# Patient Record
Sex: Female | Born: 1985 | Race: Black or African American | Hispanic: No | Marital: Married | Smoking: Current every day smoker
Health system: Southern US, Community
[De-identification: ages and names within clinical notes are randomized; demographics above are authoritative.]

## PROBLEM LIST (undated history)

## (undated) DIAGNOSIS — M1990 Unspecified osteoarthritis, unspecified site: Secondary | ICD-10-CM

## (undated) DIAGNOSIS — E282 Polycystic ovarian syndrome: Secondary | ICD-10-CM

## (undated) DIAGNOSIS — F32A Depression, unspecified: Secondary | ICD-10-CM

## (undated) DIAGNOSIS — F419 Anxiety disorder, unspecified: Secondary | ICD-10-CM

## (undated) DIAGNOSIS — F329 Major depressive disorder, single episode, unspecified: Secondary | ICD-10-CM

## (undated) HISTORY — DX: Anxiety disorder, unspecified: F41.9

## (undated) HISTORY — PX: WISDOM TOOTH EXTRACTION: SHX21

## (undated) HISTORY — PX: BREAST REDUCTION SURGERY: SHX8

---
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
## 2006-02-06 ENCOUNTER — Emergency Department (HOSPITAL_COMMUNITY): Admission: EM | Admit: 2006-02-06 | Discharge: 2006-02-07 | Payer: Self-pay | Admitting: Emergency Medicine

## 2006-06-10 ENCOUNTER — Emergency Department (HOSPITAL_COMMUNITY): Admission: EM | Admit: 2006-06-10 | Discharge: 2006-06-10 | Payer: Self-pay | Admitting: Family Medicine

## 2007-02-21 ENCOUNTER — Emergency Department (HOSPITAL_COMMUNITY): Admission: EM | Admit: 2007-02-21 | Discharge: 2007-02-21 | Payer: Self-pay | Admitting: Emergency Medicine

## 2007-04-10 ENCOUNTER — Ambulatory Visit (HOSPITAL_COMMUNITY): Admission: RE | Admit: 2007-04-10 | Discharge: 2007-04-10 | Payer: Self-pay | Admitting: Obstetrics and Gynecology

## 2007-04-10 ENCOUNTER — Encounter (INDEPENDENT_AMBULATORY_CARE_PROVIDER_SITE_OTHER): Payer: Self-pay | Admitting: Obstetrics and Gynecology

## 2007-07-16 ENCOUNTER — Inpatient Hospital Stay (HOSPITAL_COMMUNITY): Admission: AD | Admit: 2007-07-16 | Discharge: 2007-07-16 | Payer: Self-pay | Admitting: Obstetrics and Gynecology

## 2007-07-16 ENCOUNTER — Inpatient Hospital Stay (HOSPITAL_COMMUNITY): Admission: AD | Admit: 2007-07-16 | Discharge: 2007-07-19 | Payer: Self-pay | Admitting: Obstetrics and Gynecology

## 2007-07-17 ENCOUNTER — Encounter (INDEPENDENT_AMBULATORY_CARE_PROVIDER_SITE_OTHER): Payer: Self-pay | Admitting: Obstetrics and Gynecology

## 2007-08-06 ENCOUNTER — Emergency Department (HOSPITAL_COMMUNITY): Admission: EM | Admit: 2007-08-06 | Discharge: 2007-08-06 | Payer: Self-pay | Admitting: Family Medicine

## 2009-07-01 ENCOUNTER — Emergency Department (HOSPITAL_COMMUNITY): Admission: EM | Admit: 2009-07-01 | Discharge: 2009-07-01 | Payer: Self-pay | Admitting: Family Medicine

## 2010-08-23 LAB — POCT RAPID STREP A (OFFICE): Streptococcus, Group A Screen (Direct): NEGATIVE

## 2010-10-20 NOTE — Op Note (Signed)
[name], [name]             ACCOUNT NO.:  [id number]   MEDICAL RECORD NO.:  [id number]          PATIENT TYPE:  AMB   LOCATION:  SDC                           FACILITY:  [facility]   PHYSICIAN:  Dois Davenport A. Rivard, M.D. DATE OF BIRTH:  24-Apr-1986   DATE OF PROCEDURE:  04/10/2007  DATE OF DISCHARGE:                               OPERATIVE REPORT   PREOPERATIVE DIAGNOSIS:  Intrauterine pregnancy at 24 weeks with right  vulvar lesion, for removal.   POSTOPERATIVE DIAGNOSIS:  Intrauterine pregnancy at 24 weeks with right  vulvar lesion, for removal.   ANESTHESIA:  Local, Crist Fat. Rivard, M.D.   PROCEDURE:  Excision of right vulvar lesion.   SURGEON:  Crist Fat. Rivard, M.D.  no assistant.   ESTIMATED BLOOD LOSS:  Minimal.   PROCEDURE:  After being informed of the planned procedure and being  consented, the patient is taken to OR #3 and placed in the lithotomy  position.  She is prepped and draped in a sterile fashion.  The right  vulvar lesion is then sprayed with Hurricaine spray and we proceed with  local anesthesia using Marcaine 0.25% 6 mL.  After assessing adequate  level of anesthesia, an elliptical incision is made around the lesion  and that incision is continued deeply to remove the underlying lesion.  All of it is removed without incident.  We cauterize the excision area  and then close the incision with four sutures of 4-0 Monocryl.   Instrument and sponge count is complete x2.  Estimated blood loss is  minimal.  The procedure is very well-tolerated by the patient, who is  taken to recovery room and discharged home in a well and stable  condition.   SPECIMEN:  Vulvar lesion sent to pathology.      Crist Fat Rivard, M.D.  Electronically Signed     SAR/MEDQ  D:  04/10/2007  T:  04/11/2007  Job:  [id number]

## 2010-10-20 NOTE — H&P (Signed)
[name], [name]             ACCOUNT NO.:  [id number]   MEDICAL RECORD NO.:  [id number]          PATIENT TYPE:  INP   LOCATION:  9112                          FACILITY:  [facility]   PHYSICIAN:  Naima A. Dillard, M.D. DATE OF BIRTH:  10-14-1985   DATE OF ADMISSION:  07/16/2007  DATE OF DISCHARGE:                              HISTORY & PHYSICAL   This is a 25 year old gravida 1, para 0 at 37-3/7 weeks who presents for  labor evaluation.  She was seen earlier today for the same thing and had  unchanged cervix at 1 cm.  Blood pressures were labile today but labs  were normal.  Pregnancy has been followed by the M.D. service and  remarkable for:  (1) HSV II, (2) cystic fibrosis chain  positive, (3)  group B strep negative.   ALLERGIES:  NONE.   PAST OB HISTORY:  The patient is primigravida.   PAST MEDICAL HISTORY:  1. HSV II.  2. Childhood varicella.   PAST SURGICAL HISTORY:  Wisdom teeth.   FAMILY HISTORY:  Remarkable for grandparents with hypertension,  grandmother with lung cancer, grandfather and grandmother with diabetes,  grandmother with stroke, and genetic history is remarkable for father of  the baby's nephew with autism.   SOCIAL HISTORY:  The patient is single.  Father of the baby, Sammie Bench, is involved and supportive.  She does not report a religious  affiliation.  She denies any alcohol, tobacco, or drug use.   PRENATAL LABS:  Hemoglobin 13.6, blood type O positive, sickle cell  negative, rubella immune.  Hepatitis negative, HIV negative.  Pap test  negative.   HISTORY OF CURRENT PREGNANCY:  The patient entered care at [redacted] weeks  gestation.  She had an ultrasound to confirm dates.  She was found be a  cystic fibrosis carrier.  The father of the baby was instructed to be  tested.  She had a first trimester screen that was normal.  Anatomy  ultrasound was normal but incomplete and was completed at 23 weeks.  She  had an ultrasound at 26 weeks that was normal.   She had a vulvar lesion  that came back as an epidermal cyst.  She had a fainting spell with  nausea at 27 weeks.  Glucola was done at 29 weeks and was normal.  She  was started on Valtrex at term.   OBJECTIVE:  VITAL SIGNS:  Stable, afebrile.  HEENT:  Within normal limits.  NECK:  Thyroid normal, not enlarged.  CHEST:  Clear to auscultation.  HEART:  Regular rate and rhythm.  ABDOMEN:  Gravid, vertex.  [facility] shows reactive fetal heart rate  with contractions every 3-4 minutes.  Cervix is 4-5 cm, 90% effaced, -1  station, vertex presentation.  There are no HSV lesions observed.  EXTREMITIES:  Within normal limits.   PIH labs were all normal today with negative protein in the urine test.   ASSESSMENT:  1. Intrauterine pregnancy at 37-3/7 weeks.  2. Active labor.  3. Labile hypertension.   PLAN:  1. Admit to birthing suite per Dr. Normand Sloop.  2.  Routine M.D. orders.  3. Epidural p.r.n.  4. Watch blood pressure.      Marie L. Williams, C.N.M.      Naima A. Normand Sloop, M.D.  Electronically Signed    MLW/MEDQ  D:  07/17/2007  T:  07/17/2007  Job:  [id number]

## 2011-02-26 LAB — COMPREHENSIVE METABOLIC PANEL
ALT: 15
AST: 22
AST: 22
AST: 22
Albumin: 3.1 — ABNORMAL LOW
Alkaline Phosphatase: 275 — ABNORMAL HIGH
BUN: 4 — ABNORMAL LOW
CO2: 21
CO2: 27
Calcium: 8.5
Calcium: 9.1
Chloride: 103
Chloride: 103
Creatinine, Ser: 0.5
Creatinine, Ser: 0.7
GFR calc Af Amer: >60
GFR calc Af Amer: >60
GFR calc Af Amer: >60
GFR calc non Af Amer: >60
GFR calc non Af Amer: >60
Potassium: 3.8
Potassium: 4.3
Sodium: 135
Total Bilirubin: 0.4
Total Protein: 7.4

## 2011-02-26 LAB — CBC
HCT: 37.6
HCT: 39.5
Hemoglobin: 13
MCHC: 34.2
MCV: 89.2
MCV: 89.7
Platelets: 322
RBC: 4.12
RBC: 4.23
RDW: 13.2
RDW: 13.3
WBC: 10.8 — ABNORMAL HIGH
WBC: 11.2 — ABNORMAL HIGH

## 2011-02-26 LAB — URINALYSIS, ROUTINE W REFLEX MICROSCOPIC
Glucose, UA: NEGATIVE
Nitrite: NEGATIVE
Specific Gravity, Urine: <1.005 — ABNORMAL LOW
pH: 6.5

## 2011-02-26 LAB — LACTATE DEHYDROGENASE: LDH: 201

## 2011-02-26 LAB — URIC ACID: Uric Acid, Serum: 4.3

## 2011-03-01 LAB — INFLUENZA A AND B ANTIGEN (CONVERTED LAB): Inflenza A Ag: NEGATIVE

## 2011-06-04 ENCOUNTER — Ambulatory Visit: Payer: Self-pay

## 2011-10-07 ENCOUNTER — Emergency Department (HOSPITAL_COMMUNITY)
Admission: EM | Admit: 2011-10-07 | Discharge: 2011-10-07 | Disposition: A | Discharge status: Home / Self Care | Payer: 59 | Source: Home / Self Care | Attending: Family Medicine | Admitting: Family Medicine

## 2011-10-07 ENCOUNTER — Encounter (HOSPITAL_COMMUNITY): Payer: Self-pay | Admitting: Emergency Medicine

## 2011-10-07 DIAGNOSIS — R0600 Dyspnea, unspecified: Secondary | ICD-10-CM

## 2011-10-07 DIAGNOSIS — R0989 Other specified symptoms and signs involving the circulatory and respiratory systems: Secondary | ICD-10-CM

## 2011-10-07 HISTORY — DX: Polycystic ovarian syndrome: E28.2

## 2011-10-07 MED ORDER — ALBUTEROL SULFATE HFA 108 (90 BASE) MCG/ACT IN AERS: 1.0000 | INHALATION_SPRAY | Freq: Four times a day (QID) | RESPIRATORY_TRACT | Status: DC | PRN

## 2011-10-07 MED ORDER — IPRATROPIUM BROMIDE 0.02 % IN SOLN
0.5000 mg | Freq: Once | RESPIRATORY_TRACT | Status: AC
  Administered 2011-10-07: 0.5 mg via RESPIRATORY_TRACT

## 2011-10-07 MED ORDER — ALBUTEROL SULFATE (5 MG/ML) 0.5% IN NEBU
5.0000 mg | INHALATION_SOLUTION | Freq: Once | RESPIRATORY_TRACT | Status: AC
  Administered 2011-10-07: 5 mg via RESPIRATORY_TRACT

## 2011-10-07 MED ORDER — ALBUTEROL SULFATE (5 MG/ML) 0.5% IN NEBU
INHALATION_SOLUTION | RESPIRATORY_TRACT | Status: AC
  Filled 2011-10-07: qty 1

## 2011-10-07 NOTE — ED Notes (Signed)
Patient reports able to get phlegm up that was bothering her

## 2011-10-07 NOTE — ED Notes (Signed)
Breathing treatment continues. 

## 2011-10-07 NOTE — ED Notes (Addendum)
Reports intermittently sob, worsened over the past 2 days. Sob episodes can be controlled by patient if she "yawns", this seems to help relieve sob per patient.    Reports sore throat, cough about 2 weeks ago.  Patient feels like she did get well.  Patient reports a lot of stressors recently.  Yesterday patient bent over, stood back up and maybe dizzy, but not sure-did fall against refrigerator , but never lost concsiousness

## 2011-10-07 NOTE — ED Provider Notes (Signed)
History     CSN: [id number]  Arrival date & time 10/07/11  1007   First MD Initiated Contact with Patient 10/07/11 1300      Chief Complaint  Patient presents with  . Shortness of Breath    (Consider location/radiation/quality/duration/timing/severity/associated sxs/prior treatment) HPI Comments: The patient reports having the sensation of not being able to get a deep breath for some time now. No hx of asthma, no cough or congestion. No runny nose. No fever. No swelling of ankles or calves. Does smoke. Denies any flights or long road trips. No hx of prior episodes.   The history is provided by the patient.    Past Medical History  Diagnosis Date  . PCOS (polycystic ovarian syndrome)     History reviewed. No pertinent past surgical history.  History reviewed. No pertinent family history.  History  Substance Use Topics  . Smoking status: Current Everyday Smoker  . Smokeless tobacco: Not on file  . Alcohol Use: Yes    OB History    Grav Para Term Preterm Abortions TAB SAB Ect Mult Living                  Review of Systems  Constitutional: Negative.   HENT: Negative.   Cardiovascular: Negative.   Gastrointestinal: Negative.   Genitourinary: Negative.   Musculoskeletal: Negative.   Skin: Negative.     Allergies  Review of patient's allergies indicates no known allergies.  Home Medications   Current Outpatient Rx  Name Route Sig Dispense Refill  . PRENATAL MULTIVITAMIN CH Oral Take 1 tablet by mouth daily.    . ALBUTEROL SULFATE HFA 108 (90 BASE) MCG/ACT IN AERS Inhalation Inhale 1-2 puffs into the lungs every 6 (six) hours as needed for wheezing. 1 Inhaler 0    BP 125/89  Pulse 78  Temp(Src) 98.1 F (36.7 C) (Oral)  Resp 20  SpO2 98%  LMP 08/25/2011  Physical Exam  Nursing note and vitals reviewed. Constitutional: She appears well-developed and well-nourished.  HENT:  Head: Normocephalic and atraumatic.  Mouth/Throat: Oropharynx is clear and  moist.  Neck: Normal range of motion. Neck supple. No thyromegaly present.  Cardiovascular: Normal rate and regular rhythm.   Pulmonary/Chest: Effort normal and breath sounds normal. No respiratory distress. She has no wheezes.  Abdominal: Soft. Bowel sounds are normal.  Lymphadenopathy:    She has no cervical adenopathy.  Skin: Skin is warm and dry.    ED Course  Procedures (including critical care time)   Labs Reviewed  POCT PREGNANCY, URINE   No results found.   1. Dyspnea       MDM          Randa Spike, MD 10/07/11 1401

## 2011-10-07 NOTE — Discharge Instructions (Signed)
Use the inhaler as needed. Follow up with your pcp

## 2012-05-26 ENCOUNTER — Ambulatory Visit (INDEPENDENT_AMBULATORY_CARE_PROVIDER_SITE_OTHER): Payer: 59 | Admitting: Family Medicine

## 2012-05-26 VITALS — BP 114/78 | HR 75 | Temp 98.8°F | Resp 16 | Ht 64.0 in | Wt 230.0 lb

## 2012-05-26 DIAGNOSIS — N926 Irregular menstruation, unspecified: Secondary | ICD-10-CM

## 2012-05-26 DIAGNOSIS — R1110 Vomiting, unspecified: Secondary | ICD-10-CM

## 2012-05-26 DIAGNOSIS — N912 Amenorrhea, unspecified: Secondary | ICD-10-CM

## 2012-05-26 DIAGNOSIS — K5289 Other specified noninfective gastroenteritis and colitis: Secondary | ICD-10-CM

## 2012-05-26 DIAGNOSIS — K529 Noninfective gastroenteritis and colitis, unspecified: Secondary | ICD-10-CM

## 2012-05-26 DIAGNOSIS — R110 Nausea: Secondary | ICD-10-CM

## 2012-05-26 LAB — POCT URINALYSIS DIPSTICK
Glucose, UA: NEGATIVE
Ketones, UA: NEGATIVE
Spec Grav, UA: 1.025
Urobilinogen, UA: 0.2

## 2012-05-26 LAB — COMPREHENSIVE METABOLIC PANEL
ALT: 13 U/L (ref 0–35)
Albumin: 3.9 g/dL (ref 3.5–5.2)
Alkaline Phosphatase: 56 U/L (ref 39–117)
CO2: 29 mEq/L (ref 19–32)
Glucose, Bld: 82 mg/dL (ref 70–99)
Potassium: 4 mEq/L (ref 3.5–5.3)
Sodium: 140 mEq/L (ref 135–145)
Total Protein: 6.9 g/dL (ref 6.0–8.3)

## 2012-05-26 LAB — POCT CBC
Granulocyte percent: 61 %G (ref 37–80)
MID (cbc): 0.6 (ref 0–0.9)
MPV: 7.7 fL (ref 0–99.8)
POC MID %: 6.5 %M (ref 0–12)
Platelet Count, POC: 414 10*3/uL (ref 142–424)
RBC: 5.04 M/uL (ref 4.04–5.48)

## 2012-05-26 LAB — POCT UA - MICROSCOPIC ONLY

## 2012-05-26 MED ORDER — ONDANSETRON 4 MG PO TBDP: ORAL_TABLET | ORAL | Status: DC

## 2012-05-26 NOTE — Patient Instructions (Addendum)
Drink plenty of liquids(no dairy or alcohol)  Return if more abdominal pain, or vomiting or passing blood, or fever.  Take over-the-counter Zantac twice daily  Use the Zofran one every 4-6 hours as needed for vomiting.

## 2012-05-26 NOTE — Progress Notes (Signed)
Subjective: 26 year old lady who's been having problems since last Saturday. 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 She does not know if being around anyone with the same problems. Her daughter has not had this, nor her husband. She has had nausea and vomiting. Bowels have been fairly normal. There was a little bit on the loose side but the rest of the time is been normal. She's had no urinary symptoms. Her last menstrual cycle was in October, but it is not unusual for her to be very irregular.  Objective: Pleasant lady in no major distress. Chest clear. No CVA tenderness. Abdomen has normal bowel sounds, soft without masses or specific tenderness.  Assessment: Nausea and vomiting  Plan: Check a CBC and UA and blood chemistries and proceed from there  Results for orders placed in visit on 05/26/12  POCT URINE PREGNANCY      Component Value Range   Preg Test, Ur Negative    POCT CBC      Component Value Range   WBC 9.0  4.6 - 10.2 K/uL   Lymph, poc 2.9  0.6 - 3.4   POC LYMPH PERCENT 32.5  10 - 50 %L   MID (cbc) 0.6  0 - 0.9   POC MID % 6.5  0 - 12 %M   POC Granulocyte 5.5  2 - 6.9   Granulocyte percent 61.0  37 - 80 %G   RBC 5.04  4.04 - 5.48 M/uL   Hemoglobin 14.2  12.2 - 16.2 g/dL   HCT, POC 16.1  09.6 - 47.9 %   MCV 89.8  80 - 97 fL   MCH, POC 28.2  27 - 31.2 pg   MCHC 31.3 (*) 31.8 - 35.4 g/dL   RDW, POC 04.5     Platelet Count, POC 414  142 - 424 K/uL   MPV 7.7  0 - 99.8 fL  POCT UA - MICROSCOPIC ONLY      Component Value Range   WBC, Ur, HPF, POC 0-1     RBC, urine, microscopic 8-12     Bacteria, U Microscopic TRACE     Mucus, UA SMALL     Epithelial cells, urine per micros 0-3     Crystals, Ur, HPF, POC NEG     Casts, Ur, LPF, POC NEG     Yeast, UA NEG    POCT URINALYSIS DIPSTICK      Component Value Range   Color, UA dark yellow     Clarity, UA cloudy     Glucose, UA neg     Bilirubin, UA neg     Ketones, UA neg     Spec Grav, UA 1.025     Blood, UA trace     pH, UA  7.0     Protein, UA trace     Urobilinogen, UA 0.2     Nitrite, UA neg     Leukocytes, UA Negative     Will treat symptomatically

## 2012-05-28 ENCOUNTER — Encounter: Payer: Self-pay | Admitting: *Deleted

## 2012-09-27 ENCOUNTER — Other Ambulatory Visit: Payer: Self-pay | Admitting: Gynecology

## 2013-01-09 LAB — OB RESULTS CONSOLE RPR: RPR: NONREACTIVE

## 2013-01-09 LAB — OB RESULTS CONSOLE RUBELLA ANTIBODY, IGM: Rubella: IMMUNE

## 2013-01-09 LAB — OB RESULTS CONSOLE ANTIBODY SCREEN: Antibody Screen: NEGATIVE

## 2013-01-09 LAB — OB RESULTS CONSOLE ABO/RH: RH Type: POSITIVE

## 2013-01-09 LAB — OB RESULTS CONSOLE HEPATITIS B SURFACE ANTIGEN: Hepatitis B Surface Ag: NEGATIVE

## 2013-01-09 LAB — OB RESULTS CONSOLE HIV ANTIBODY (ROUTINE TESTING): HIV: NONREACTIVE

## 2013-01-23 LAB — OB RESULTS CONSOLE GC/CHLAMYDIA
Chlamydia: NEGATIVE
Gonorrhea: NEGATIVE

## 2013-04-21 ENCOUNTER — Encounter (HOSPITAL_COMMUNITY): Payer: Self-pay | Admitting: Emergency Medicine

## 2013-04-21 ENCOUNTER — Emergency Department (HOSPITAL_COMMUNITY)
Admission: EM | Admit: 2013-04-21 | Discharge: 2013-04-21 | Disposition: A | Discharge status: Home / Self Care | Payer: 59 | Source: Home / Self Care | Attending: Family Medicine | Admitting: Family Medicine

## 2013-04-21 DIAGNOSIS — J069 Acute upper respiratory infection, unspecified: Secondary | ICD-10-CM

## 2013-04-21 LAB — POCT RAPID STREP A: Streptococcus, Group A Screen (Direct): NEGATIVE

## 2013-04-21 MED ORDER — GUAIFENESIN-CODEINE 100-10 MG/5ML PO SOLN: 5.0000 mL | Freq: Every evening | ORAL | Status: DC | PRN

## 2013-04-21 NOTE — ED Provider Notes (Signed)
[name] is a 27 y.o. female who presents to Urgent Care today for sore throat coughing congestion for 5 days. Patient has not tried any medications yet and she is [redacted] weeks pregnant and unsure what is safe to take. She denies any fevers or chills or trouble breathing or new nausea vomiting or diarrhea. She denies any known sick contacts he feels well otherwise. No chest pains or shortness of breath or palpitations.   Past Medical History  Diagnosis Date  . PCOS (polycystic ovarian syndrome)   . Anxiety    History  Substance Use Topics  . Smoking status: Current Every Day Smoker    Types: Cigarettes  . Smokeless tobacco: Not on file  . Alcohol Use: Yes   ROS as above Medications reviewed. No current facility-administered medications for this encounter.   Current Outpatient Prescriptions  Medication Sig Dispense Refill  . Prenatal Vit-Fe Fumarate-FA (PRENATAL MULTIVITAMIN) TABS Take 1 tablet by mouth daily.      Marland Kitchen albuterol (PROVENTIL HFA;VENTOLIN HFA) 108 (90 BASE) MCG/ACT inhaler Inhale 1-2 puffs into the lungs every 6 (six) hours as needed for wheezing.  1 Inhaler  0  . guaiFENesin-codeine 100-10 MG/5ML syrup Take 5 mLs by mouth at bedtime as needed for cough.  120 mL  0  . ondansetron (ZOFRAN ODT) 4 MG disintegrating tablet Take every 4 hours as needed for vomiting or nausea  10 tablet  0    Exam:  BP 118/79  Pulse 82  Temp(Src) 97.9 F (36.6 C) (Oral)  Resp 16  SpO2 100%  LMP 03/26/2012 Gen: Well NAD HEENT: EOMI,  MMM, posterior pharynx mild cobblestoning, and tympanic membranes are normal appearing bilaterally. Lungs: CTABL Nl WOB Heart: RRR no MRG Abd: NABS, NT, ND Exts: Non edematous BL  LE, warm and well perfused.   Results for orders placed during the hospital encounter of 04/21/13 (from the past 24 hour(s))  POCT RAPID STREP A ([facility] ONLY)     Status: None   Collection Time    04/21/13  9:41 AM      Result Value Range   Streptococcus, Group A  Screen (Direct) NEGATIVE  NEGATIVE   No results found.  Assessment and Plan: 27 y.o. female with viral URI with cough. Plan to treat symptomatically with Tylenol and codeine containing cough medication which are safe to use at this stage of pregnancy. Advised patient against NSAIDs. Followup with primary care provider if not improving. Discussed warning signs or symptoms. Please see discharge instructions. Patient expresses understanding.      Rodolph Bong, MD 04/21/13 [phone]

## 2013-04-21 NOTE — ED Notes (Signed)
Pt c/o sore throat onset Monday Sxs also include a dry cough... Daughter was treated for pos strep about 2 weeks ago Denies: f/v/n/d Pt is 6 months preg

## 2013-04-23 LAB — CULTURE, GROUP A STREP

## 2013-07-11 ENCOUNTER — Inpatient Hospital Stay (HOSPITAL_COMMUNITY): Payer: 59 | Admitting: Anesthesiology

## 2013-07-11 ENCOUNTER — Inpatient Hospital Stay (HOSPITAL_COMMUNITY)
Admission: AD | Admit: 2013-07-11 | Discharge: 2013-07-14 | DRG: 775 | Disposition: A | Discharge status: Home / Self Care | Payer: 59 | Source: Ambulatory Visit | Attending: Obstetrics and Gynecology | Admitting: Obstetrics and Gynecology

## 2013-07-11 ENCOUNTER — Encounter (HOSPITAL_COMMUNITY): Payer: 59 | Admitting: Anesthesiology

## 2013-07-11 ENCOUNTER — Encounter (HOSPITAL_COMMUNITY): Payer: Self-pay | Admitting: *Deleted

## 2013-07-11 DIAGNOSIS — O99214 Obesity complicating childbirth: Secondary | ICD-10-CM

## 2013-07-11 DIAGNOSIS — O4290 Premature rupture of membranes, unspecified as to length of time between rupture and onset of labor, unspecified weeks of gestation: Principal | ICD-10-CM | POA: Diagnosis present

## 2013-07-11 DIAGNOSIS — F341 Dysthymic disorder: Secondary | ICD-10-CM | POA: Diagnosis present

## 2013-07-11 DIAGNOSIS — E669 Obesity, unspecified: Secondary | ICD-10-CM | POA: Diagnosis present

## 2013-07-11 DIAGNOSIS — O99344 Other mental disorders complicating childbirth: Secondary | ICD-10-CM | POA: Diagnosis present

## 2013-07-11 DIAGNOSIS — O42919 Preterm premature rupture of membranes, unspecified as to length of time between rupture and onset of labor, unspecified trimester: Secondary | ICD-10-CM | POA: Diagnosis present

## 2013-07-11 HISTORY — DX: Major depressive disorder, single episode, unspecified: F32.9

## 2013-07-11 HISTORY — DX: Depression, unspecified: F32.A

## 2013-07-11 LAB — TYPE AND SCREEN
ABO/RH(D): O POS
Antibody Screen: NEGATIVE

## 2013-07-11 LAB — CBC
HCT: 32.5 % — ABNORMAL LOW (ref 36.0–46.0)
Hemoglobin: 11.3 g/dL — ABNORMAL LOW (ref 12.0–15.0)
MCH: 28.6 pg (ref 26.0–34.0)
MCHC: 34.8 g/dL (ref 30.0–36.0)
MCV: 82.3 fL (ref 78.0–100.0)
PLATELETS: 294 10*3/uL (ref 150–400)
RBC: 3.95 MIL/uL (ref 3.87–5.11)
RDW: 13.2 % (ref 11.5–15.5)
WBC: 9.1 10*3/uL (ref 4.0–10.5)

## 2013-07-11 LAB — RPR: RPR: NONREACTIVE

## 2013-07-11 LAB — POCT FERN TEST: POCT FERN TEST: POSITIVE

## 2013-07-11 LAB — ABO/RH: ABO/RH(D): O POS

## 2013-07-11 MED ORDER — MEASLES, MUMPS & RUBELLA VAC ~~LOC~~ INJ
0.5000 mL | INJECTION | Freq: Once | SUBCUTANEOUS | Status: DC
  Filled 2013-07-11: qty 0.5

## 2013-07-11 MED ORDER — DIBUCAINE 1 % RE OINT: 1.0000 "application " | TOPICAL_OINTMENT | RECTAL | Status: DC | PRN

## 2013-07-11 MED ORDER — OXYTOCIN 40 UNITS IN LACTATED RINGERS INFUSION - SIMPLE MED
62.5000 mL/h | INTRAVENOUS | Status: DC
  Administered 2013-07-11: 62.5 mL/h via INTRAVENOUS

## 2013-07-11 MED ORDER — SENNOSIDES-DOCUSATE SODIUM 8.6-50 MG PO TABS
2.0000 | ORAL_TABLET | ORAL | Status: DC
  Administered 2013-07-11 – 2013-07-14 (×4): 2 via ORAL
  Filled 2013-07-11: qty 2
  Filled 2013-07-11: qty 1
  Filled 2013-07-11 (×3): qty 2

## 2013-07-11 MED ORDER — LACTATED RINGERS IV SOLN
INTRAVENOUS | Status: DC
  Administered 2013-07-11 (×2): via INTRAVENOUS

## 2013-07-11 MED ORDER — OXYTOCIN BOLUS FROM INFUSION: 500.0000 mL | INTRAVENOUS | Status: DC

## 2013-07-11 MED ORDER — EPHEDRINE 5 MG/ML INJ
10.0000 mg | INTRAVENOUS | Status: DC | PRN
  Filled 2013-07-11: qty 4
  Filled 2013-07-11: qty 2

## 2013-07-11 MED ORDER — DIPHENHYDRAMINE HCL 50 MG/ML IJ SOLN: 12.5000 mg | INTRAMUSCULAR | Status: DC | PRN

## 2013-07-11 MED ORDER — LIDOCAINE HCL (PF) 1 % IJ SOLN
30.0000 mL | INTRAMUSCULAR | Status: DC | PRN
  Filled 2013-07-11 (×2): qty 30

## 2013-07-11 MED ORDER — SIMETHICONE 80 MG PO CHEW: 80.0000 mg | CHEWABLE_TABLET | ORAL | Status: DC | PRN

## 2013-07-11 MED ORDER — BENZOCAINE-MENTHOL 20-0.5 % EX AERO
1.0000 "application " | INHALATION_SPRAY | CUTANEOUS | Status: DC | PRN
  Administered 2013-07-11: 1 via TOPICAL
  Filled 2013-07-11: qty 56

## 2013-07-11 MED ORDER — PHENYLEPHRINE 40 MCG/ML (10ML) SYRINGE FOR IV PUSH (FOR BLOOD PRESSURE SUPPORT)
80.0000 ug | PREFILLED_SYRINGE | INTRAVENOUS | Status: DC | PRN
  Filled 2013-07-11: qty 10
  Filled 2013-07-11: qty 2

## 2013-07-11 MED ORDER — ONDANSETRON HCL 4 MG/2ML IJ SOLN: 4.0000 mg | INTRAMUSCULAR | Status: DC | PRN

## 2013-07-11 MED ORDER — CITRIC ACID-SODIUM CITRATE 334-500 MG/5ML PO SOLN: 30.0000 mL | ORAL | Status: DC | PRN

## 2013-07-11 MED ORDER — OXYTOCIN 40 UNITS IN LACTATED RINGERS INFUSION - SIMPLE MED
1.0000 m[IU]/min | INTRAVENOUS | Status: DC
  Filled 2013-07-11: qty 1000

## 2013-07-11 MED ORDER — PENICILLIN G POTASSIUM 5000000 UNITS IJ SOLR
2.5000 10*6.[IU] | INTRAMUSCULAR | Status: DC
  Administered 2013-07-11 (×2): 2.5 10*6.[IU] via INTRAVENOUS
  Filled 2013-07-11 (×4): qty 2.5

## 2013-07-11 MED ORDER — WITCH HAZEL-GLYCERIN EX PADS: 1.0000 "application " | MEDICATED_PAD | CUTANEOUS | Status: DC | PRN

## 2013-07-11 MED ORDER — OXYCODONE-ACETAMINOPHEN 5-325 MG PO TABS
1.0000 | ORAL_TABLET | ORAL | Status: DC | PRN
  Administered 2013-07-12 – 2013-07-13 (×2): 1 via ORAL
  Filled 2013-07-11 (×2): qty 1

## 2013-07-11 MED ORDER — DIPHENHYDRAMINE HCL 25 MG PO CAPS: 25.0000 mg | ORAL_CAPSULE | Freq: Four times a day (QID) | ORAL | Status: DC | PRN

## 2013-07-11 MED ORDER — PENICILLIN G POTASSIUM 5000000 UNITS IJ SOLR
5.0000 10*6.[IU] | Freq: Once | INTRAVENOUS | Status: AC
  Administered 2013-07-11: 5 10*6.[IU] via INTRAVENOUS
  Filled 2013-07-11: qty 5

## 2013-07-11 MED ORDER — LACTATED RINGERS IV SOLN: INTRAVENOUS | Status: DC

## 2013-07-11 MED ORDER — OXYCODONE-ACETAMINOPHEN 5-325 MG PO TABS: 1.0000 | ORAL_TABLET | ORAL | Status: DC | PRN

## 2013-07-11 MED ORDER — LACTATED RINGERS IV SOLN
500.0000 mL | Freq: Once | INTRAVENOUS | Status: AC
  Administered 2013-07-11: 500 mL via INTRAVENOUS

## 2013-07-11 MED ORDER — PRENATAL MULTIVITAMIN CH
1.0000 | ORAL_TABLET | Freq: Every day | ORAL | Status: DC
Start: 2013-07-12 — End: 2013-07-15
  Administered 2013-07-12 – 2013-07-14 (×3): 1 via ORAL
  Filled 2013-07-11 (×3): qty 1

## 2013-07-11 MED ORDER — EPHEDRINE 5 MG/ML INJ
10.0000 mg | INTRAVENOUS | Status: DC | PRN
  Filled 2013-07-11: qty 2

## 2013-07-11 MED ORDER — ONDANSETRON HCL 4 MG PO TABS: 4.0000 mg | ORAL_TABLET | ORAL | Status: DC | PRN

## 2013-07-11 MED ORDER — FENTANYL 2.5 MCG/ML BUPIVACAINE 1/10 % EPIDURAL INFUSION (WH - ANES)
14.0000 mL/h | INTRAMUSCULAR | Status: DC | PRN
  Administered 2013-07-11: 14 mL/h via EPIDURAL
  Filled 2013-07-11: qty 125

## 2013-07-11 MED ORDER — ACETAMINOPHEN 325 MG PO TABS
650.0000 mg | ORAL_TABLET | ORAL | Status: DC | PRN
Start: 2013-07-11 — End: 2013-07-11

## 2013-07-11 MED ORDER — IBUPROFEN 600 MG PO TABS: 600.0000 mg | ORAL_TABLET | Freq: Four times a day (QID) | ORAL | Status: DC | PRN

## 2013-07-11 MED ORDER — LACTATED RINGERS IV SOLN: 500.0000 mL | INTRAVENOUS | Status: DC | PRN

## 2013-07-11 MED ORDER — LIDOCAINE HCL (PF) 1 % IJ SOLN
INTRAMUSCULAR | Status: DC | PRN
  Administered 2013-07-11 (×4): 4 mL

## 2013-07-11 MED ORDER — IBUPROFEN 600 MG PO TABS
600.0000 mg | ORAL_TABLET | Freq: Four times a day (QID) | ORAL | Status: DC
  Administered 2013-07-11 – 2013-07-14 (×13): 600 mg via ORAL
  Filled 2013-07-11 (×16): qty 1

## 2013-07-11 MED ORDER — ONDANSETRON HCL 4 MG/2ML IJ SOLN: 4.0000 mg | Freq: Four times a day (QID) | INTRAMUSCULAR | Status: DC | PRN

## 2013-07-11 MED ORDER — TETANUS-DIPHTH-ACELL PERTUSSIS 5-2.5-18.5 LF-MCG/0.5 IM SUSP: 0.5000 mL | Freq: Once | INTRAMUSCULAR | Status: DC

## 2013-07-11 MED ORDER — BUTORPHANOL TARTRATE 1 MG/ML IJ SOLN: 1.0000 mg | INTRAMUSCULAR | Status: DC | PRN

## 2013-07-11 MED ORDER — TERBUTALINE SULFATE 1 MG/ML IJ SOLN: 0.2500 mg | Freq: Once | INTRAMUSCULAR | Status: DC | PRN

## 2013-07-11 MED ORDER — PHENYLEPHRINE 40 MCG/ML (10ML) SYRINGE FOR IV PUSH (FOR BLOOD PRESSURE SUPPORT)
80.0000 ug | PREFILLED_SYRINGE | INTRAVENOUS | Status: DC | PRN
  Filled 2013-07-11: qty 2

## 2013-07-11 MED ORDER — LANOLIN HYDROUS EX OINT: TOPICAL_OINTMENT | CUTANEOUS | Status: DC | PRN

## 2013-07-11 MED ORDER — MEDROXYPROGESTERONE ACETATE 150 MG/ML IM SUSP: 150.0000 mg | INTRAMUSCULAR | Status: DC | PRN

## 2013-07-11 NOTE — MAU Note (Signed)
Patient presents to MAU with c/o leaking clear fluid since 5:10 am. Irregular uc's.

## 2013-07-11 NOTE — H&P (Signed)
[name] is a 28 y.o. female G2P1 @ 35+5wks presenting for PPROM. Pt reports leaking clear fluid at 5am.  No regular ctx or vb.  + FM.    History OB History   Grav Para Term Preterm Abortions TAB SAB Ect Mult Living   2 1 1       1      Past Medical History  Diagnosis Date  . PCOS (polycystic ovarian syndrome)   . Anxiety     no meds  . Depression     no meds   History reviewed. No pertinent past surgical history. Family History: family history includes Diabetes in her maternal grandfather and paternal grandmother; Hypertension in her maternal grandmother and paternal grandfather. Social History:  reports that she has quit smoking. Her smoking use included Cigarettes. She smoked 0.00 packs per day. She does not have any smokeless tobacco history on file. She reports that she does not drink alcohol or use illicit drugs.   Prenatal Transfer Tool  Maternal Diabetes: No Genetic Screening: Declined Maternal Ultrasounds/Referrals: Normal Fetal Ultrasounds or other Referrals:  None Maternal Substance Abuse:  No Significant Maternal Medications:  None Significant Maternal Lab Results:  None Other Comments:  None  ROS  Dilation: 3 Effacement (%): 60 Station: -3;-2 Exam by:: L. Cresenzo, RN Blood pressure 130/74, pulse 97, temperature 98.1 F (36.7 C), temperature source Oral, resp. rate 20, height 5\' 5"  (1.651 m), weight 115.667 kg (255 lb), SpO2 99.00%. Exam Physical Exam  Gen - NAD Abd - gravid, NT Ext - NT, no edema PV - + grossly ruptured, cvx 3cm, clear fluid Prenatal labs: ABO, Rh: --/--/O POS, O POS (02/04 1030) Antibody: NEG (02/04 1030) Rubella: Immune (08/05 0000) RPR: Nonreactive (08/05 0000)  HBsAg: Negative (08/05 0000)  HIV: Non-reactive (08/05 0000)  GBS:     Assessment/Plan: Admit PCN prophylaxis - GBS unknown rec augmentation with pitocin, pt wants to await arrival of husband before starting pitocin   [name] 07/11/2013, 5:23  PM

## 2013-07-11 NOTE — Progress Notes (Signed)
Pt more uncomfortable - feeling better w/ epidural  FHT reassuring Toco irregular Cvx 5-6cm  A/P:  Exp mngt PCN

## 2013-07-11 NOTE — Anesthesia Preprocedure Evaluation (Signed)
Anesthesia Evaluation  Patient identified by MRN, date of birth, ID band Patient awake    Reviewed: Allergy & Precautions, H&P , NPO status , Patient's Chart, lab work & pertinent test results, reviewed documented beta blocker date and time   History of Anesthesia Complications Negative for: history of anesthetic complications  Airway Mallampati: III TM Distance: >3 FB Neck ROM: full    Dental  (+) Teeth Intact   Pulmonary neg pulmonary ROS, former smoker,  breath sounds clear to auscultation        Cardiovascular negative cardio ROS  Rhythm:regular Rate:Normal     Neuro/Psych Anxiety Depression negative neurological ROS     GI/Hepatic negative GI ROS, Neg liver ROS,   Endo/Other  Morbid obesity  Renal/GU negative Renal ROS     Musculoskeletal   Abdominal   Peds  Hematology negative hematology ROS (+)   Anesthesia Other Findings   Reproductive/Obstetrics (+) Pregnancy                           Anesthesia Physical Anesthesia Plan  ASA: III  Anesthesia Plan: Epidural   Post-op Pain Management:    Induction:   Airway Management Planned:   Additional Equipment:   Intra-op Plan:   Post-operative Plan:   Informed Consent: I have reviewed the patients History and Physical, chart, labs and discussed the procedure including the risks, benefits and alternatives for the proposed anesthesia with the patient or authorized representative who has indicated his/her understanding and acceptance.     Plan Discussed with:   Anesthesia Plan Comments:         Anesthesia Quick Evaluation

## 2013-07-11 NOTE — MAU Provider Note (Signed)
  History     CSN: [id number]  Arrival date and time: 07/11/13 [id number]   First Provider Initiated Contact with Patient 07/11/13 1001      Chief Complaint  Patient presents with  . Labor Eval   HPI  [name] is a 28 y.o. G2P1001 at 8337w5d who presents today for SROM. She states that her water broke at 0510. She states that contractions started soon after. She denies any complications with the pregnancy.   Past Medical History  Diagnosis Date  . PCOS (polycystic ovarian syndrome)   . Anxiety     History reviewed. No pertinent past surgical history.  Family History  Problem Relation Age of Onset  . Hypertension Maternal Grandmother   . Diabetes Maternal Grandfather   . Diabetes Paternal Grandmother   . Hypertension Paternal Grandfather     History  Substance Use Topics  . Smoking status: Former Smoker    Types: Cigarettes  . Smokeless tobacco: Not on file  . Alcohol Use: No    Allergies: No Known Allergies  Prescriptions prior to admission  Medication Sig Dispense Refill  . acetaminophen (TYLENOL) 500 MG tablet Take 1,000 mg by mouth every 6 (six) hours as needed.      . Prenatal Vit-Fe Fumarate-FA (PRENATAL MULTIVITAMIN) TABS Take 1 tablet by mouth daily.      . pseudoephedrine (SUDAFED) 30 MG tablet Take 30 mg by mouth every 4 (four) hours as needed for congestion.      . valACYclovir (VALTREX) 500 MG tablet Take 500 mg by mouth 2 (two) times daily.        ROS Physical Exam   Blood pressure 132/85, pulse 111, temperature 98 F (36.7 C), temperature source Oral, resp. rate 18, height 5\' 5"  (1.651 m), weight 115.667 kg (255 lb), SpO2 98.00%.  Physical Exam  Nursing note and vitals reviewed. Constitutional: She is oriented to person, place, and time. She appears well-developed and well-nourished. No distress.  Cardiovascular: Normal rate.   Respiratory: Effort normal.  GI: Soft. There is no tenderness.  Neurological: She is alert and oriented to person,  place, and time.  Skin: Skin is warm and dry.  Psychiatric: She has a normal mood and affect.    FHT: 145, moderate with accels, 2 variables when initially placed on monitors. None since.  Toco: q6 mins  MAU Course  Procedures  Results for orders placed during the hospital encounter of 07/11/13 (from the past 24 hour(s))  POCT FERN TEST     Status: None   Collection Time    07/11/13 10:08 AM      Result Value Range   POCT Fern Test Positive = ruptured amniotic membanes      1033: D/W Dr. Renaldo FiddlerAdkins, will admit to labor and delivery. She will put in orders, and be by to check the patient. Will defer vaginal exam at this time.  Assessment and Plan  PPROM Admit to labor and delivery   Tawnya CrookHogan, Heather Donovan 07/11/2013, 10:05 AM

## 2013-07-11 NOTE — Progress Notes (Signed)
SVD of vigerous female infant w/ apgars of 8,9.  NICU team in attendance for delivery b/c prematurity Placenta delivered spontaneous w/ 3VC.   No lacerations  Fundus firm.  EBL 300cc .

## 2013-07-11 NOTE — Anesthesia Procedure Notes (Signed)
Epidural Patient location during procedure: OB Start time: 07/11/2013 4:41 PM  Staffing Performed by: anesthesiologist   Preanesthetic Checklist Completed: patient identified, site marked, surgical consent, pre-op evaluation, timeout performed, IV checked, risks and benefits discussed and monitors and equipment checked  Epidural Patient position: sitting Prep: site prepped and draped and DuraPrep Patient monitoring: continuous pulse ox and blood pressure Approach: midline Injection technique: LOR air  Needle:  Needle type: Tuohy  Needle gauge: 17 G Needle length: 9 cm and 9 Needle insertion depth: 7 cm Catheter type: closed end flexible Catheter size: 19 Gauge Catheter at skin depth: 12 cm Test dose: negative  Assessment Events: blood not aspirated, injection not painful, no injection resistance, negative IV test and no paresthesia  Additional Notes Discussed risk of headache, infection, bleeding, nerve injury and failed or incomplete block.  Patient voices understanding and wishes to proceed.  Epidural placed easily on first attempt.  No paresthesia.  Patient tolerated procedure well with no apparent complications.  Jasmine DecemberA. Taylan Marez, MDReason for block:procedure for pain

## 2013-07-12 ENCOUNTER — Encounter (HOSPITAL_COMMUNITY): Payer: Self-pay

## 2013-07-12 LAB — CBC
HCT: 32.8 % — ABNORMAL LOW (ref 36.0–46.0)
Hemoglobin: 11.2 g/dL — ABNORMAL LOW (ref 12.0–15.0)
MCH: 28.4 pg (ref 26.0–34.0)
MCHC: 34.1 g/dL (ref 30.0–36.0)
MCV: 83 fL (ref 78.0–100.0)
PLATELETS: 279 10*3/uL (ref 150–400)
RBC: 3.95 MIL/uL (ref 3.87–5.11)
RDW: 13.2 % (ref 11.5–15.5)
WBC: 13.3 10*3/uL — ABNORMAL HIGH (ref 4.0–10.5)

## 2013-07-12 NOTE — Progress Notes (Signed)
Post Partum Day 1 Subjective: no complaints, up ad lib, voiding and tolerating PO. Baby in NICU  Objective: Blood pressure 134/84, pulse 101, temperature 98.2 F (36.8 C), temperature source Oral, resp. rate 18, height 5\' 5"  (1.651 m), weight 255 lb (115.667 kg), SpO2 96.00%, unknown if currently breastfeeding.  Physical Exam:  General: alert and cooperative Lochia: appropriate Uterine Fundus: firm Incision: perineum intact DVT Evaluation: No evidence of DVT seen on physical exam. Negative Homan's sign. No cords or calf tenderness. No significant calf/ankle edema.   Recent Labs  07/11/13 1030 07/12/13 0544  HGB 11.3* 11.2*  HCT 32.5* 32.8*    Assessment/Plan: Plan for discharge tomorrow   LOS: 1 day   CURTIS,CAROL G 07/12/2013, 8:48 AM

## 2013-07-12 NOTE — Lactation Note (Signed)
This note was copied from the chart of Tamara Boyce MediciLekessa Mallin. Lactation Consultation Note   Initial consult with this mom of a NICU baby, now 17 hours post partum, and baby is 34 6/7 weeks corrected gestation. Mom has very large breasts, so I fashioned a hands free bra for her from  The hospital mesh panties. Hand exp taught - mom has drops of colostrum Mom has private insurance and need to call for a DEP and to see if they will also cover a DEP rental, until her pump arrives. i will follow this mom and baby in the NICU.  Patient Name: [name] [id number]'s Date: 07/12/2013 Reason for consult: Initial assessment;Late preterm infant;NICU baby   Maternal Data Formula Feeding for Exclusion: Yes (baby in NICU) Infant to breast within first hour of birth: No Breastfeeding delayed due to:: Infant status Has patient been taught Hand Expression?: Yes Does the patient have breastfeeding experience prior to this delivery?: No  Feeding Feeding Type: Bottle Fed - Formula Nipple Type: Slow - flow  LATCH Score/Interventions Latch: Repeated attempts needed to sustain latch, nipple held in mouth throughout feeding, stimulation needed to elicit sucking reflex. Intervention(s): Adjust position;Assist with latch  Audible Swallowing: None Intervention(s): Skin to skin  Type of Nipple: Flat  Comfort (Breast/Nipple): Soft / non-tender     Hold (Positioning): Assistance needed to correctly position infant at breast and maintain latch.  LATCH Score: 5  Lactation Tools Discussed/Used Tools: Pump Breast pump type: Double-Electric Breast Pump WIC Program: No Pump Review: Setup, frequency, and cleaning;Milk Storage;Other (comment) (premie setting, hand expression and teaching from the NICU booklet) Initiated by:: bedside rn Date initiated:: 07/11/13   Consult Status Consult Status: Follow-up Date: 07/13/13 Follow-up type: In-patient    Alfred LevinsLee, Deltha Bernales Anne 07/12/2013, 1:55 PM

## 2013-07-12 NOTE — Anesthesia Postprocedure Evaluation (Signed)
  Anesthesia Post-op Note  Patient: [name]  Procedure(s) Performed: * No procedures listed *  Patient Location: PACU and Mother/Baby  Anesthesia Type:Epidural  Level of Consciousness: awake, alert  and oriented  Airway and Oxygen Therapy: Patient Spontanous Breathing  Post-op Pain: none  Post-op Assessment: Post-op Vital signs reviewed, Patient's Cardiovascular Status Stable, No headache, No backache, No residual numbness and No residual motor weakness  Post-op Vital Signs: Reviewed and stable  Complications: No apparent anesthesia complications

## 2013-07-12 NOTE — Progress Notes (Signed)
07/12/13 1550  Clinical Encounter Type  Visited With Patient and family together (husband Mal AmabileBrock, pt's father)  Visit Type Initial;Spiritual support;Social support  Spiritual Encounters  Spiritual Needs Emotional   Visited with pt, husband, and her father on WU to introduce Spiritual Care and chaplain availability.  Family appreciative.  At this time they are jovial and very pleased with [name]' progress, though they name anxiety about the possibility of facing leaving him in NICU upon mom's discharge.  Provided empathic listening, affirmation, and encouragement.  [place] will follow as we see family in NICU, but please also page as needed:  [phone].  Thank you.  [address]Chaplain Limuel Nieblas [place], [place] [phone]

## 2013-07-13 MED ORDER — IBUPROFEN 600 MG PO TABS: 600.0000 mg | ORAL_TABLET | Freq: Four times a day (QID) | ORAL | Status: DC

## 2013-07-13 MED ORDER — OXYCODONE-ACETAMINOPHEN 5-325 MG PO TABS: 1.0000 | ORAL_TABLET | ORAL | Status: DC | PRN

## 2013-07-13 NOTE — Discharge Summary (Signed)
Obstetric Discharge Summary Reason for Admission: rupture of membranes Prenatal Procedures: ultrasound Intrapartum Procedures: spontaneous vaginal delivery Postpartum Procedures: none Complications-Operative and Postpartum: none Hemoglobin  Date Value Range Status  07/12/2013 11.2* 12.0 - 15.0 g/dL Final  47/82/956212/20/2013 13.014.2  12.2 - 16.2 g/dL Final     HCT  Date Value Range Status  07/12/2013 32.8* 36.0 - 46.0 % Final     HCT, POC  Date Value Range Status  05/26/2012 45.3  37.7 - 47.9 % Final    Physical Exam:  General: alert and cooperative Lochia: appropriate Uterine Fundus: firm Incision: perineum intact DVT Evaluation: No evidence of DVT seen on physical exam. Negative Homan's sign. No cords or calf tenderness.  Discharge Diagnoses: preterm delivery @34  weeks  Discharge Information: Date: 07/13/2013 Activity: pelvic rest Diet: routine Medications: PNV, Ibuprofen and Percocet Condition: stable Instructions: refer to practice specific booklet Discharge to: home   Newborn Data: Live born female  Birth Weight: 5 lb 12.8 oz (2630 g) APGAR: 8, 9  Home with NICU.  Venessa Wickham G 07/13/2013, 9:02 AM

## 2013-07-13 NOTE — Progress Notes (Signed)
NP came to see pt for discharge this am... Pt inquired about staying an extra night, pt spoke with [employer] and was told by [employer] that she could stay and would only be required to pay 10% of cost for extra night... Pt spoke with significant other, and decided to stay an extra night... Julio Sicksarol Curtis, NP notified and gave order to cancel discharge for today.

## 2013-07-13 NOTE — Lactation Note (Signed)
This note was copied from the chart of Tamara Boyce MediciLekessa Figge. Lactation Consultation Note      Follow up consult with this mom and baby, in the NIC, now 17 hours post partum, and 34 6.7 weeks corrected gestation. Mom latched the baby for the first time with this assistance of a NICU nurse, and the baby was latched deeply with strong suckles.  I will follow this family in the NICU  Patient Name: [name] UJWJX'BToday's Date: 07/13/2013     Maternal Data    Feeding Feeding Type: Bottle Fed - Formula Nipple Type: Slow - flow Length of feed: 20 min  LATCH Score/Interventions Latch: Grasps breast easily, tongue down, lips flanged, rhythmical sucking. Intervention(s): Breast compression  Audible Swallowing: None Intervention(s): Skin to skin  Type of Nipple: Everted at rest and after stimulation  Comfort (Breast/Nipple): Soft / non-tender     Hold (Positioning): No assistance needed to correctly position infant at breast.  LATCH Score: 8  Lactation Tools Discussed/Used     Consult Status      Alfred LevinsLee, Chavela Justiniano Anne 07/13/2013, 2:59 PM

## 2013-07-13 NOTE — Lactation Note (Signed)
This note was copied from the chart of Tamara Boyce [name]. Lactation Consultation Note       Follow up consult with this mom of a term baby, now 42 hours post partum. Mom has been pumping and reports and increase in her expressed amounts.  I gave mom a hand pump and instructed her in it's use. i also gave mom the paper work for a DEP, if she decides to rent one tomorrow. i advised mom that she should rent a DEP, to use until her  Insurance DEP arrives  Patient Name: [name] GNFAO'ZToday's Date: 07/13/2013 Reason for consult: Follow-up assessment;NICU baby;Late preterm infant   Maternal Data    Feeding Feeding Type: Bottle Fed - Formula Nipple Type: Slow - flow Length of feed: 20 min  LATCH Score/Interventions Latch: Grasps breast easily, tongue down, lips flanged, rhythmical sucking.  Audible Swallowing: None  Type of Nipple: Everted at rest and after stimulation  Comfort (Breast/Nipple): Soft / non-tender     Hold (Positioning): No assistance needed to correctly position infant at breast.  LATCH Score: 8  Lactation Tools Discussed/Used Breast pump type: Manual (mom instructedinit's use)   Consult Status Consult Status: Follow-up Date: 07/14/13 Follow-up type: In-patient    [name], [name] 07/13/2013, 3:28 PM

## 2013-07-14 NOTE — Progress Notes (Signed)
Post Partum Day 3 Subjective: no complaints  Objective: Blood pressure 117/48, pulse 81, temperature 97.8 F (36.6 C), temperature source Oral, resp. rate 18, height 5\' 5"  (1.651 m), weight 115.667 kg (255 lb), SpO2 100.00%, unknown if currently breastfeeding.  Physical Exam:  General: alert Lochia: appropriate Uterine Fundus: firm Incision: healing well DVT Evaluation: No evidence of DVT seen on physical exam.   Recent Labs  07/11/13 1030 07/12/13 0544  HGB 11.3* 11.2*  HCT 32.5* 32.8*    Assessment/Plan: Discharge home   LOS: 3 days   Altin Sease M 07/14/2013, 8:17 AM

## 2013-07-16 NOTE — Progress Notes (Signed)
Post discharge review completed. 

## 2014-04-08 ENCOUNTER — Encounter (HOSPITAL_COMMUNITY): Payer: Self-pay

## 2014-11-08 LAB — OB RESULTS CONSOLE RUBELLA ANTIBODY, IGM: RUBELLA: IMMUNE

## 2014-11-08 LAB — OB RESULTS CONSOLE GC/CHLAMYDIA
CHLAMYDIA, DNA PROBE: NEGATIVE
Gonorrhea: NEGATIVE

## 2014-11-08 LAB — OB RESULTS CONSOLE ANTIBODY SCREEN: ANTIBODY SCREEN: NEGATIVE

## 2014-11-08 LAB — OB RESULTS CONSOLE ABO/RH: RH TYPE: POSITIVE

## 2014-11-08 LAB — OB RESULTS CONSOLE HEPATITIS B SURFACE ANTIGEN: HEP B S AG: NEGATIVE

## 2014-11-08 LAB — OB RESULTS CONSOLE HIV ANTIBODY (ROUTINE TESTING): HIV: NONREACTIVE

## 2014-11-08 LAB — OB RESULTS CONSOLE RPR: RPR: NONREACTIVE

## 2014-11-08 LAB — OB RESULTS CONSOLE GBS: GBS: POSITIVE

## 2015-06-08 NOTE — L&D Delivery Note (Signed)
Received 1 dose of IV Fentanyl w/ good result. Shortly thereafter, pt sat up for epidural and vtx was noted to be at the introitus. Pt became hysterical at that point. Cat 1 FHRT noted throughout the 1st and 2nd stage, w/ ctxs occurring every 1-3 minutes.  Delivery Note At 1:57 AM a viable female "as yet unnamed" was delivered precipitously via Vaginal, Spontaneous Delivery (Presentation: Occiput Anterior restituting to ROA). APGARS: 8, 9; weight 8 lbs 6.4 oz (3810 g).   Placenta status: Intact, Spontaneous, marginal insertion - sent to path for analysis. Cord: 3 vessels with the following complications: None. Cord pH: NA  Anesthesia: None  Episiotomy: None Lacerations: None Suture Repair: NA Est. Blood Loss (mL): 300  Mom to postpartum.  Baby to Couplet care / Skin to Skin.  Not adequately treated for GBS positive status due to precipitous labor & delivery.  Plans to breastfeed.  Planning inpatient BTL (consent signed on 05/05/15 per pt report, but form not located in pt's office record. Will continue our search and inquire with office as well.  Couple elects outpatient circumcision.  SWS consult entered due to pt w/ h/o anxiety and depression (no meds).  Sherre ScarletWILLIAMS, Verble Styron 06/23/2015, 2:31 AM

## 2015-06-16 ENCOUNTER — Inpatient Hospital Stay (HOSPITAL_COMMUNITY)
Admission: AD | Admit: 2015-06-16 | Discharge: 2015-06-16 | Disposition: A | Discharge status: Home / Self Care | Payer: Medicaid Other | Source: Ambulatory Visit | Attending: Obstetrics & Gynecology | Admitting: Obstetrics & Gynecology

## 2015-06-16 ENCOUNTER — Encounter (HOSPITAL_COMMUNITY): Payer: Self-pay

## 2015-06-16 DIAGNOSIS — O471 False labor at or after 37 completed weeks of gestation: Secondary | ICD-10-CM | POA: Diagnosis present

## 2015-06-16 DIAGNOSIS — O479 False labor, unspecified: Secondary | ICD-10-CM

## 2015-06-16 DIAGNOSIS — Z3A37 37 weeks gestation of pregnancy: Secondary | ICD-10-CM | POA: Insufficient documentation

## 2015-06-16 LAB — PROTEIN / CREATININE RATIO, URINE
CREATININE, URINE: 298 mg/dL
Protein Creatinine Ratio: 0.1 mg/mg{Cre} (ref 0.00–0.15)
TOTAL PROTEIN, URINE: 30 mg/dL

## 2015-06-16 LAB — COMPREHENSIVE METABOLIC PANEL
ALBUMIN: 3.2 g/dL — AB (ref 3.5–5.0)
ALK PHOS: 173 U/L — AB (ref 38–126)
ALT: 30 U/L (ref 14–54)
ANION GAP: 13 (ref 5–15)
AST: 24 U/L (ref 15–41)
BUN: 7 mg/dL (ref 6–20)
CHLORIDE: 102 mmol/L (ref 101–111)
CO2: 21 mmol/L — AB (ref 22–32)
Calcium: 9.1 mg/dL (ref 8.9–10.3)
Creatinine, Ser: 0.66 mg/dL (ref 0.44–1.00)
GFR calc non Af Amer: >60 mL/min (ref >60)
GLUCOSE: 133 mg/dL — AB (ref 65–99)
Potassium: 3.8 mmol/L (ref 3.5–5.1)
SODIUM: 136 mmol/L (ref 135–145)
Total Bilirubin: 0.7 mg/dL (ref 0.3–1.2)
Total Protein: 7.1 g/dL (ref 6.5–8.1)

## 2015-06-16 LAB — CBC
HCT: 35.8 % — ABNORMAL LOW (ref 36.0–46.0)
HEMOGLOBIN: 12.2 g/dL (ref 12.0–15.0)
MCH: 29 pg (ref 26.0–34.0)
MCHC: 34.1 g/dL (ref 30.0–36.0)
MCV: 85 fL (ref 78.0–100.0)
Platelets: 279 10*3/uL (ref 150–400)
RBC: 4.21 MIL/uL (ref 3.87–5.11)
RDW: 14.4 % (ref 11.5–15.5)
WBC: 8.5 10*3/uL (ref 4.0–10.5)

## 2015-06-16 LAB — URIC ACID: URIC ACID, SERUM: 5.3 mg/dL (ref 2.3–6.6)

## 2015-06-16 NOTE — MAU Note (Signed)
Pt G3P2 with c/o contractions since 1700 and every five minutes.  Denies vag bleeding or leaking of fluid. Reports baby is moving well.

## 2015-06-16 NOTE — MAU Provider Note (Signed)
History     30yo, G3P1102, 37.3 wks presents to the MAU unannounced for contractions since 1700 and every 5 min. +FM, denies lof or vb.   Patient Active Problem List   Diagnosis Date Noted  . Preterm premature rupture of membranes (PPROM) delivered, current hospitalization 07/11/2013  . SVD (spontaneous vaginal delivery) 07/11/2013    No chief complaint on file.  HPI  OB History    Gravida Para Term Preterm AB TAB SAB Ectopic Multiple Living   3 2 1 1      2       Past Medical History  Diagnosis Date  . PCOS (polycystic ovarian syndrome)   . Anxiety     no meds  . Depression     no meds    Past Surgical History  Procedure Laterality Date  . Wisdom tooth extraction      Family History  Problem Relation Age of Onset  . Hypertension Maternal Grandmother   . Diabetes Maternal Grandfather   . Diabetes Paternal Grandmother   . Hypertension Paternal Grandfather     Social History  Substance Use Topics  . Smoking status: Former Smoker    Types: Cigarettes    Quit date: 06/16/2011  . Smokeless tobacco: None  . Alcohol Use: No    Allergies: No Known Allergies  Prescriptions prior to admission  Medication Sig Dispense Refill Last Dose  . calcium carbonate (TUMS - DOSED IN MG ELEMENTAL CALCIUM) 500 MG chewable tablet Chew 2 tablets by mouth 2 (two) times daily as needed for indigestion or heartburn.   06/15/2015 at Unknown time  . Prenatal Vit-Fe Fumarate-FA (PRENATAL MULTIVITAMIN) TABS Take 1 tablet by mouth daily.   06/15/2015 at Unknown time  . valACYclovir (VALTREX) 500 MG tablet Take 500 mg by mouth 2 (two) times daily.   06/15/2015 at Unknown time  . acetaminophen (TYLENOL) 500 MG tablet Take 1,000 mg by mouth every 6 (six) hours as needed for mild pain.    prn  . [DISCONTINUED] ibuprofen (ADVIL,MOTRIN) 600 MG tablet Take 1 tablet (600 mg total) by mouth every 6 (six) hours. 30 tablet 1   . [DISCONTINUED] oxyCODONE-acetaminophen (PERCOCET/ROXICET) 5-325 MG per tablet  Take 1-2 tablets by mouth every 4 (four) hours as needed for severe pain (moderate - severe pain). 30 tablet 0 More than a month at Unknown time    ROS Physical Exam   Blood pressure 120/82, pulse 99, temperature 98.5 F (36.9 C), temperature source Oral, resp. rate 16, height 5\' 5"  (1.651 m), weight 117.482 kg (259 lb), unknown if currently breastfeeding.   SVE:  4/70/-2 by k.weiss, RN FH:  UC   Initially irregular  5-10 min, after 1 hr in MAU contraction stopped to rare    Physical Exam  Constitutional: She is oriented to person, place, and time. She appears well-developed and well-nourished.  HENT:  Head: Normocephalic and atraumatic.  Eyes: Conjunctivae are normal. Pupils are equal, round, and reactive to light.  Neck: Normal range of motion. Neck supple.  Cardiovascular: Normal rate and regular rhythm.   Respiratory: Effort normal and breath sounds normal.  GI: Soft. Bowel sounds are normal.  Musculoskeletal: Normal range of motion.  Neurological: She is alert and oriented to person, place, and time. She has normal reflexes.  Skin: Skin is warm and dry.  Psychiatric: She has a normal mood and affect. Her behavior is normal. Judgment and thought content normal.      ED Course  Assessment: IUP 37.[redacted]wks ega Braxton hicks  Contractions Membranes intact Cat 1 FT   Plan: Discharge home Labor precautions provided  Call if symptoms worsen   Alphonzo Severance CNM, MSN 06/16/2015 10:23 PM

## 2015-06-16 NOTE — Discharge Instructions (Signed)
Braxton Hicks Contractions °Contractions of the uterus can occur throughout pregnancy. Contractions are not always a sign that you are in labor.  °WHAT ARE BRAXTON HICKS CONTRACTIONS?  °Contractions that occur before labor are called Braxton Hicks contractions, or false labor. Toward the end of pregnancy (32-34 weeks), these contractions can develop more often and may become more forceful. This is not true labor because these contractions do not result in opening (dilatation) and thinning of the cervix. They are sometimes difficult to tell apart from true labor because these contractions can be forceful and people have different pain tolerances. You should not feel embarrassed if you go to the hospital with false labor. Sometimes, the only way to tell if you are in true labor is for your health care provider to look for changes in the cervix. °If there are no prenatal problems or other health problems associated with the pregnancy, it is completely safe to be sent home with false labor and await the onset of true labor. °HOW CAN YOU TELL THE DIFFERENCE BETWEEN TRUE AND FALSE LABOR? °False Labor °· The contractions of false labor are usually shorter and not as hard as those of true labor.   °· The contractions are usually irregular.   °· The contractions are often felt in the front of the lower abdomen and in the groin.   °· The contractions may go away when you walk around or change positions while lying down.   °· The contractions get weaker and are shorter lasting as time goes on.   °· The contractions do not usually become progressively stronger, regular, and closer together as with true labor.   °True Labor °· Contractions in true labor last 30-70 seconds, become very regular, usually become more intense, and increase in frequency.   °· The contractions do not go away with walking.   °· The discomfort is usually felt in the top of the uterus and spreads to the lower abdomen and low back.   °· True labor can be  determined by your health care provider with an exam. This will show that the cervix is dilating and getting thinner.   °WHAT TO REMEMBER °· Keep up with your usual exercises and follow other instructions given by your health care provider.   °· Take medicines as directed by your health care provider.   °· Keep your regular prenatal appointments.   °· Eat and drink lightly if you think you are going into labor.   °· If Braxton Hicks contractions are making you uncomfortable:   °¨ Change your position from lying down or resting to walking, or from walking to resting.   °¨ Sit and rest in a tub of warm water.   °¨ Drink 2-3 glasses of water. Dehydration may cause these contractions.   °¨ Do slow and deep breathing several times an hour.   °WHEN SHOULD I SEEK IMMEDIATE MEDICAL CARE? °Seek immediate medical care if: °· Your contractions become stronger, more regular, and closer together.   °· You have fluid leaking or gushing from your vagina.   °· You have a fever.   °· You pass blood-tinged mucus.   °· You have vaginal bleeding.   °· You have continuous abdominal pain.   °· You have low back pain that you never had before.   °· You feel your baby's head pushing down and causing pelvic pressure.   °· Your baby is not moving as much as it used to.   °  °This information is not intended to replace advice given to you by your health care provider. Make sure you discuss any questions you have with your health care   provider. °  °Document Released: 05/24/2005 Document Revised: 05/29/2013 Document Reviewed: 03/05/2013 °Elsevier Interactive Patient Education ©2016 [employer] Inc[employer] ° °

## 2015-06-23 ENCOUNTER — Inpatient Hospital Stay (HOSPITAL_COMMUNITY)
Admission: AD | Admit: 2015-06-23 | Discharge: 2015-06-25 | DRG: 767 | Disposition: A | Discharge status: Home / Self Care | Payer: Medicaid Other | Source: Ambulatory Visit | Attending: Obstetrics & Gynecology | Admitting: Obstetrics & Gynecology

## 2015-06-23 ENCOUNTER — Encounter (HOSPITAL_COMMUNITY): Payer: Self-pay

## 2015-06-23 DIAGNOSIS — Z8249 Family history of ischemic heart disease and other diseases of the circulatory system: Secondary | ICD-10-CM

## 2015-06-23 DIAGNOSIS — O99214 Obesity complicating childbirth: Secondary | ICD-10-CM | POA: Diagnosis present

## 2015-06-23 DIAGNOSIS — O09219 Supervision of pregnancy with history of pre-term labor, unspecified trimester: Secondary | ICD-10-CM

## 2015-06-23 DIAGNOSIS — A6000 Herpesviral infection of urogenital system, unspecified: Secondary | ICD-10-CM

## 2015-06-23 DIAGNOSIS — Z302 Encounter for sterilization: Secondary | ICD-10-CM

## 2015-06-23 DIAGNOSIS — Z820 Family history of epilepsy and other diseases of the nervous system: Secondary | ICD-10-CM

## 2015-06-23 DIAGNOSIS — Z6841 Body Mass Index (BMI) 40.0 and over, adult: Secondary | ICD-10-CM | POA: Diagnosis not present

## 2015-06-23 DIAGNOSIS — Z823 Family history of stroke: Secondary | ICD-10-CM

## 2015-06-23 DIAGNOSIS — O99824 Streptococcus B carrier state complicating childbirth: Secondary | ICD-10-CM | POA: Diagnosis present

## 2015-06-23 DIAGNOSIS — Z833 Family history of diabetes mellitus: Secondary | ICD-10-CM

## 2015-06-23 DIAGNOSIS — Z8669 Personal history of other diseases of the nervous system and sense organs: Secondary | ICD-10-CM

## 2015-06-23 DIAGNOSIS — Z8742 Personal history of other diseases of the female genital tract: Secondary | ICD-10-CM

## 2015-06-23 DIAGNOSIS — O09899 Supervision of other high risk pregnancies, unspecified trimester: Secondary | ICD-10-CM

## 2015-06-23 DIAGNOSIS — Z8659 Personal history of other mental and behavioral disorders: Secondary | ICD-10-CM

## 2015-06-23 DIAGNOSIS — Z87891 Personal history of nicotine dependence: Secondary | ICD-10-CM

## 2015-06-23 DIAGNOSIS — Z3A38 38 weeks gestation of pregnancy: Secondary | ICD-10-CM | POA: Diagnosis not present

## 2015-06-23 DIAGNOSIS — O9832 Other infections with a predominantly sexual mode of transmission complicating childbirth: Secondary | ICD-10-CM | POA: Diagnosis present

## 2015-06-23 DIAGNOSIS — Z8759 Personal history of other complications of pregnancy, childbirth and the puerperium: Secondary | ICD-10-CM

## 2015-06-23 DIAGNOSIS — R8271 Bacteriuria: Secondary | ICD-10-CM

## 2015-06-23 DIAGNOSIS — IMO0001 Reserved for inherently not codable concepts without codable children: Secondary | ICD-10-CM

## 2015-06-23 LAB — COMPREHENSIVE METABOLIC PANEL
ALK PHOS: 188 U/L — AB (ref 38–126)
ALT: 34 U/L (ref 14–54)
AST: 26 U/L (ref 15–41)
Albumin: 3.2 g/dL — ABNORMAL LOW (ref 3.5–5.0)
Anion gap: 11 (ref 5–15)
BUN: 8 mg/dL (ref 6–20)
CALCIUM: 9.7 mg/dL (ref 8.9–10.3)
CO2: 22 mmol/L (ref 22–32)
CREATININE: 0.59 mg/dL (ref 0.44–1.00)
Chloride: 105 mmol/L (ref 101–111)
Glucose, Bld: 108 mg/dL — ABNORMAL HIGH (ref 65–99)
Potassium: 4 mmol/L (ref 3.5–5.1)
Sodium: 138 mmol/L (ref 135–145)
Total Bilirubin: 0.9 mg/dL (ref 0.3–1.2)
Total Protein: 6.5 g/dL (ref 6.5–8.1)

## 2015-06-23 LAB — CBC
HCT: 36 % (ref 36.0–46.0)
HEMOGLOBIN: 12.3 g/dL (ref 12.0–15.0)
MCH: 28.9 pg (ref 26.0–34.0)
MCHC: 34.2 g/dL (ref 30.0–36.0)
MCV: 84.5 fL (ref 78.0–100.0)
Platelets: 278 10*3/uL (ref 150–400)
RBC: 4.26 MIL/uL (ref 3.87–5.11)
RDW: 14.5 % (ref 11.5–15.5)
WBC: 8.5 10*3/uL (ref 4.0–10.5)

## 2015-06-23 LAB — LACTATE DEHYDROGENASE: LDH: 127 U/L (ref 98–192)

## 2015-06-23 LAB — TYPE AND SCREEN
ABO/RH(D): O POS
ANTIBODY SCREEN: NEGATIVE

## 2015-06-23 LAB — RPR: RPR Ser Ql: NONREACTIVE

## 2015-06-23 LAB — URIC ACID: URIC ACID, SERUM: 5.1 mg/dL (ref 2.3–6.6)

## 2015-06-23 MED ORDER — DIPHENHYDRAMINE HCL 50 MG/ML IJ SOLN: 12.5000 mg | INTRAMUSCULAR | Status: DC | PRN

## 2015-06-23 MED ORDER — DIPHENHYDRAMINE HCL 25 MG PO CAPS: 25.0000 mg | ORAL_CAPSULE | Freq: Four times a day (QID) | ORAL | Status: DC | PRN

## 2015-06-23 MED ORDER — FENTANYL CITRATE (PF) 100 MCG/2ML IJ SOLN
INTRAMUSCULAR | Status: AC
  Filled 2015-06-23: qty 2

## 2015-06-23 MED ORDER — ONDANSETRON HCL 4 MG PO TABS: 4.0000 mg | ORAL_TABLET | ORAL | Status: DC | PRN

## 2015-06-23 MED ORDER — CITRIC ACID-SODIUM CITRATE 334-500 MG/5ML PO SOLN: 30.0000 mL | ORAL | Status: DC | PRN

## 2015-06-23 MED ORDER — OXYCODONE-ACETAMINOPHEN 5-325 MG PO TABS: 1.0000 | ORAL_TABLET | ORAL | Status: DC | PRN

## 2015-06-23 MED ORDER — PHENYLEPHRINE 40 MCG/ML (10ML) SYRINGE FOR IV PUSH (FOR BLOOD PRESSURE SUPPORT)
80.0000 ug | PREFILLED_SYRINGE | INTRAVENOUS | Status: DC | PRN
  Filled 2015-06-23: qty 2

## 2015-06-23 MED ORDER — ONDANSETRON HCL 4 MG/2ML IJ SOLN: 4.0000 mg | Freq: Four times a day (QID) | INTRAMUSCULAR | Status: DC | PRN

## 2015-06-23 MED ORDER — FERROUS SULFATE 325 (65 FE) MG PO TABS
325.0000 mg | ORAL_TABLET | Freq: Two times a day (BID) | ORAL | Status: DC
  Administered 2015-06-23 – 2015-06-25 (×4): 325 mg via ORAL
  Filled 2015-06-23 (×4): qty 1

## 2015-06-23 MED ORDER — PRENATAL MULTIVITAMIN CH
1.0000 | ORAL_TABLET | Freq: Every day | ORAL | Status: DC
  Administered 2015-06-23 – 2015-06-25 (×2): 1 via ORAL
  Filled 2015-06-23 (×2): qty 1

## 2015-06-23 MED ORDER — FENTANYL 2.5 MCG/ML BUPIVACAINE 1/10 % EPIDURAL INFUSION (WH - ANES)
INTRAMUSCULAR | Status: AC
  Filled 2015-06-23: qty 125

## 2015-06-23 MED ORDER — WITCH HAZEL-GLYCERIN EX PADS: 1.0000 "application " | MEDICATED_PAD | CUTANEOUS | Status: DC | PRN

## 2015-06-23 MED ORDER — ONDANSETRON HCL 4 MG/2ML IJ SOLN: 4.0000 mg | INTRAMUSCULAR | Status: DC | PRN

## 2015-06-23 MED ORDER — OXYTOCIN BOLUS FROM INFUSION: 500.0000 mL | INTRAVENOUS | Status: DC

## 2015-06-23 MED ORDER — LIDOCAINE HCL (PF) 1 % IJ SOLN
30.0000 mL | INTRAMUSCULAR | Status: DC | PRN
  Filled 2015-06-23: qty 30

## 2015-06-23 MED ORDER — BENZOCAINE-MENTHOL 20-0.5 % EX AERO
1.0000 "application " | INHALATION_SPRAY | CUTANEOUS | Status: DC | PRN
  Administered 2015-06-23: 1 via TOPICAL
  Filled 2015-06-23: qty 56

## 2015-06-23 MED ORDER — EPHEDRINE 5 MG/ML INJ
10.0000 mg | INTRAVENOUS | Status: DC | PRN
  Filled 2015-06-23: qty 2

## 2015-06-23 MED ORDER — FENTANYL CITRATE (PF) 100 MCG/2ML IJ SOLN
100.0000 ug | INTRAMUSCULAR | Status: DC | PRN
  Administered 2015-06-23: 100 ug via INTRAVENOUS

## 2015-06-23 MED ORDER — FENTANYL 2.5 MCG/ML BUPIVACAINE 1/10 % EPIDURAL INFUSION (WH - ANES): 14.0000 mL/h | INTRAMUSCULAR | Status: DC | PRN

## 2015-06-23 MED ORDER — LACTATED RINGERS IV SOLN
INTRAVENOUS | Status: DC
Start: 2015-06-23 — End: 2015-06-25
  Administered 2015-06-23 – 2015-06-24 (×2): via INTRAVENOUS

## 2015-06-23 MED ORDER — ACETAMINOPHEN 325 MG PO TABS
650.0000 mg | ORAL_TABLET | ORAL | Status: DC | PRN
  Administered 2015-06-24: 650 mg via ORAL
  Filled 2015-06-23: qty 2

## 2015-06-23 MED ORDER — SENNOSIDES-DOCUSATE SODIUM 8.6-50 MG PO TABS
2.0000 | ORAL_TABLET | ORAL | Status: DC
  Administered 2015-06-24 – 2015-06-25 (×2): 2 via ORAL
  Filled 2015-06-23 (×2): qty 2

## 2015-06-23 MED ORDER — SODIUM CHLORIDE 0.9 % IV SOLN
2.0000 g | Freq: Once | INTRAVENOUS | Status: AC
  Administered 2015-06-23: 2 g via INTRAVENOUS
  Filled 2015-06-23: qty 2000

## 2015-06-23 MED ORDER — LACTATED RINGERS IV SOLN: 500.0000 mL | INTRAVENOUS | Status: DC | PRN

## 2015-06-23 MED ORDER — SODIUM CHLORIDE 0.9 % IJ SOLN
3.0000 mL | INTRAMUSCULAR | Status: DC | PRN
  Administered 2015-06-23: 3 mL via INTRAVENOUS

## 2015-06-23 MED ORDER — DIBUCAINE 1 % RE OINT: 1.0000 "application " | TOPICAL_OINTMENT | RECTAL | Status: DC | PRN

## 2015-06-23 MED ORDER — OXYCODONE-ACETAMINOPHEN 5-325 MG PO TABS: 2.0000 | ORAL_TABLET | ORAL | Status: DC | PRN

## 2015-06-23 MED ORDER — FLEET ENEMA 7-19 GM/118ML RE ENEM: 1.0000 | ENEMA | RECTAL | Status: DC | PRN

## 2015-06-23 MED ORDER — PHENYLEPHRINE 40 MCG/ML (10ML) SYRINGE FOR IV PUSH (FOR BLOOD PRESSURE SUPPORT)
PREFILLED_SYRINGE | INTRAVENOUS | Status: AC
  Filled 2015-06-23: qty 10

## 2015-06-23 MED ORDER — SIMETHICONE 80 MG PO CHEW: 80.0000 mg | CHEWABLE_TABLET | ORAL | Status: DC | PRN

## 2015-06-23 MED ORDER — OXYTOCIN 10 UNIT/ML IJ SOLN
2.5000 [IU]/h | INTRAVENOUS | Status: DC
  Administered 2015-06-23: 39.96 [IU]/h via INTRAVENOUS
  Filled 2015-06-23: qty 4

## 2015-06-23 MED ORDER — IBUPROFEN 600 MG PO TABS
600.0000 mg | ORAL_TABLET | Freq: Four times a day (QID) | ORAL | Status: DC
  Administered 2015-06-23 – 2015-06-25 (×10): 600 mg via ORAL
  Filled 2015-06-23 (×11): qty 1

## 2015-06-23 MED ORDER — TETANUS-DIPHTH-ACELL PERTUSSIS 5-2.5-18.5 LF-MCG/0.5 IM SUSP: 0.5000 mL | Freq: Once | INTRAMUSCULAR | Status: DC

## 2015-06-23 MED ORDER — ACETAMINOPHEN 325 MG PO TABS: 650.0000 mg | ORAL_TABLET | ORAL | Status: DC | PRN

## 2015-06-23 MED ORDER — LANOLIN HYDROUS EX OINT: TOPICAL_OINTMENT | CUTANEOUS | Status: DC | PRN

## 2015-06-23 MED ORDER — ZOLPIDEM TARTRATE 5 MG PO TABS: 5.0000 mg | ORAL_TABLET | Freq: Every evening | ORAL | Status: DC | PRN

## 2015-06-23 NOTE — Lactation Note (Signed)
This note was copied from the chart of [name]. Lactation Consultation Note  Patient Name: [name] UJWJX'BToday's Date: 06/23/2015 Reason for consult: Initial assessment;Other (Comment) (per mom baby last fed at 0825 for 20 mins , mom encouraged to call with feeding cues , see LC note )  Baby is 8 hours old and has been to the breast x 3 for 15 -20 mins, presently sleeping in the bedside crib.  Per mom only breast fed 2nd baby for 17 months, and then recently at home wanted to re- latch.  LC discussed if the 30 year  old continues to want to latch - it is important to always meet the needs to the baby 1st and share that with the 30 year old, on the other hand  The 30 year old may have been curious and may not ask again .  LC discussed the importance of skin to skin feedings to enhance milk coming in quicker.  Mom asked about starting pumping. LC recommended to keep it simple for now and offering the breast with feeding cues, and a lot of skin to skin with be beneficial.  Per mom also very tired, LC also encouraged rest, naps, plenty of fluids.  LC did read on moms hx PCOS. If mom breast fed for 17 months - milk supply probably wasn't a challenge. Mother informed of post-discharge support and given phone number to the lactation department, including services for phone call assistance; out-patient appointments; and breastfeeding support group. List of other breastfeeding resources in the community given in the handout. Encouraged mother to call for problems or concerns related to breastfeeding.   Maternal Data Does the patient have breastfeeding experience prior to this delivery?: Yes  Feeding Feeding Type:  (baby last fed at 0825 for 20 mins ) Length of feed: 20 min  LATCH Score/Interventions                      Lactation Tools Discussed/Used     Consult Status Consult Status: Follow-up Date: 06/23/15 Follow-up type: In-patient    Kathrin Greathouseorio, Challis Crill  Ann 06/23/2015, 10:08 AM

## 2015-06-23 NOTE — H&P (Signed)
[name] is a 30 y.o. female, G3P1102 at 38.2 weeks, presenting for regular, painful ctxs and SROM (clear fluid) since 23:50 last evening. Endorses fetal movement. Denies h/a, visual changes, epigastric pain, difficulty breathing, VB or recent outbreak(s) or prodome -- H/O HSV. Desires postpartum tubal ligation prior to discharge. States consent signed and witnessed in office on 05/05/2015.  Patient Active Problem List   Diagnosis Date Noted  . Normal labor 06/23/2015  . GBS bacteriuria 06/23/2015  . History of depression 06/23/2015  . History of anxiety 06/23/2015  . History of PCOS 06/23/2015  . Morbid obesity (HCC) 06/23/2015  . Postpartum tubal ligation planned 06/23/2015  . Genital herpes 06/23/2015  . H/O preterm delivery, currently pregnant 06/23/2015  . History of prior pregnancy with SGA newborn x2 06/23/2015  . Preterm labor third tri w preterm del third tri, fetus 2 06/23/2015  . History of gestational hypertension - first delivery - no meds 06/23/2015  . H/O migraine 06/23/2015    History of present pregnancy: Patient entered care at 6.6 weeks.   EDC of 07/05/15 was established by u/s at 8.3 wks.   Anatomy scan: 20.2 weeks. EFW: 14 oz (83.5%tile). FHR 153 bpm. Singleton pregnancy. Vertex pres. Anterior placenta. Placental edge is 7.4 cm from the internal os- normal. Cvx closed. Normal fluid - VP= 5.1 cm. Profile/Palate, NB seen. Philtrum seen. Open hands, 5th digit, heel and feet seen. Anatomy is complete. Additional US evaluations: 8.3 wks (dating): Anteverted uterus. Singleton 8.3 wks IUP. FHR 159 bpm. YS seen. Ovaries and adnexas unremarkable. 12.4 wks (NT screening): CRL 6.32 --5762w4d. FHR 160 bpm. Anteverted uterus. Amnion seen. Placenta appears to be anterior. Normal fluid. Consistent with established GA. Cx closed. Ovaries/adnexas unremarkable. Singleton IUP. Normal NT at 1.2 cm.  36.6 wks (S>D): VERTEX PRESENTATION. ANTERIOR PLACENTA. NORMAL AFI (15.45 CM -  55TH%TILE). ADNEXAS UNREMARKABLE. EFW 3233G (7LBS 2OZ - 72.3%).  Significant prenatal events: Prepregnant wt = 230 lbs. TWG this pregnancy = 24.5 lbs. Normal pregnancy complaints. Treated w/ PCN for GBS bacteriuria on 11/08/14; TOC neg on 05/19/15. Declined 17P this pregnancy as she believes the EDD calculation in her 2nd pregnancy was incorrect. She did however receive progesterone during that pregnancy. Normal progesterone level this pregnancy. Treated for BV at 20.2 wks. Morbidly obese - normal glucola x 2. Desires outpatient circumcision for infant and tubal ligation during current hospitalization. H/O Gestational Hypertension in first pregnancy (no meds), no issues this pregnancy. H/O HSV - started on suppressive Valtrex therapy at 34 wks due to deliveries at 38 and 35.5 wks respectively. S>D at 35.3 wks. Growth scan completed at 36.6 wks - results as noted above. Received both Tdap and flu on 06/19/15. Seen in MAU on 06/16/15 for ctxs; labor ruled out. Last evaluation: Office 37.5 wks by Dr. Normand Sloopillard. Vtx presentation. S>D. FHR 144 bpm. Cvx 3/50/-3. BP 114/68. Wt: 254.5 lbs.  OB History    Gravida Para Term Preterm AB TAB SAB Ectopic Multiple Living   3 2 1 1      2      SVB on 07/17/2007 @ 38 wks, 24-hr labor, female infant "[place]", birthwt 5+4. Labor epidural. [facility] Dr[facility] Norm[facility] Sloopillard. Pregnancy c/b Gestational Hypertension. 1st degree lac noted & repaired. PTB on 07/11/2013 @ 35.5 wks, 15-hr labor, female infant "[place]", birthwt 5+12. Labor epidural. [facility].  Past Medical History  Diagnosis Date  . PCOS (polycystic ovarian syndrome)   . Anxiety  no meds  . Depression     no meds   Past Surgical History  Procedure Laterality Date  . Wisdom tooth extraction     Family History: family history includes Diabetes in her maternal grandfather and paternal grandmother; Hypertension in her maternal grandmother and paternal grandfather.Hypertension in her PGM and  MGF, stroke in her PGM, Alzheimer's in her MGF, anemia in her sister, thyroid gland disorder in her paternal aunt, lung cancer in her MGM and prostate cancer in her MGF. Social History:  reports that she quit smoking about 4 years ago. Her smoking use included Cigarettes. She does not have any smokeless tobacco history on file. 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    Prenatal Transfer Tool  Maternal Diabetes: No Genetic Screening: Normal Maternal Ultrasounds/Referrals: Normal Fetal Ultrasounds or other Referrals: No Maternal Substance Abuse:  No Significant Maternal Medications:  Meds include: Other: PNV, Valtrex 1g daily since 34 wks Significant Maternal Lab Results: Lab values include: Group B Strep positive  TDAP: 06/19/15 Flu: 06/19/15  ROS:10 Systems reviewed and are negative for acute change except as noted in the HPI.   No Known Allergies  VS on admission = BP 137/97, pulse 83, temperature 97.5, temperature source oral, resp. rate 18, height 5'5" (1.651 m), weight 115.667 kg (255 lbs), Sp02 98%.  Gen: Distress Chest clear Heart RRR without murmur Abd gravid, NT, FH 40.5 cm Speculum: No lesions on cvx, perineum, buttocks or in vaginal vault. Grossly ruptured w/ clear pooling in vault -- able to see fetus' hair Pelvic: 5-6/90/-1  Cephalic by Leopolds and VE EFW: 7 3/4 lbs. Pelvis proven to 5+12 FHR: Category 1 UCs: every 1-3 minutes  Prenatal labs: ABO, Rh: --/--/O POS (01/16 0120) Antibody: NEG (01/16 0120) Rubella: Immune (11/08/14)   RPR: Nonreactive (06/03 0000)  HBsAg: Negative (06/03 0000)  HIV: Non-reactive (06/03 0000)  GBS: Positive (06/03 0000)  Sickle cell/Hgb electrophoresis: Normal study (11/08/14) Pap: Normal (12/04/14) GC: Neg  (11/08/14) Chlamydia: Neg (11/08/14) Genetic screenings: 1st trimester aneuploidy neg (12/25/14); AFP4 Neg (01/22/15) Glucola: Early glucola normal at 103; 114 (normal) at 28 wks Other: Normal progesterone (22.5) and HBA1C (5.3) on 12/04/14. Vitamin D level normal (30) on 02/17/15 Hgb 13.0 at NOB, 11.4 at 28 weeks  Assessment: IUP at 38.2 wks Entering active phase labor H/O depression and anxiety; no meds H/O genital HSV - no recent outbreak(s) or prodrome, no lesions on exam. On suppressive Valtrex since 34 weeks GBS positive Cat 1 FHRT Morbid obesity (BMI 42.7)  Plan: Admit to [facility]. Routine [facility] orders. preE labs w/ admission labs due to initial BP in MAU 137/97, and also due to gHTN in first pregnancy. Pain med/epidural prn.  Ampicillin for GBS prophylaxis due to advanced labor. SWS in the pp period due to h/o depression and anxiety. Unable to locate BTL consent in [place]. Pt states will call office this morning to inquire, but we will continue to search in the meantime. Expect progress and SVD.  Robyne Askew, MS 06/23/2015, 1:30 AM

## 2015-06-23 NOTE — MAU Note (Signed)
Pt states SROM at 2350-clear. Contractions every 3-5 mins. +FM. Denies vag bleeding. Was 3.5 cm on Thursday.

## 2015-06-23 NOTE — Progress Notes (Addendum)
Subjective: Postpartum Day 0: Vaginal delivery, no laceration Patient up ad lib, reports no syncope or dizziness. Feeding:  Breast Contraceptive plan:  Desires BTL  Plans outpatient circumcision.  Objective: Vital signs in last 24 hours: Temp:  [97.4 F (36.3 C)-98.7 F (37.1 C)] 98.7 F (37.1 C) (01/16 0845) Pulse Rate:  [77-90] 82 (01/16 0845) Resp:  [16-18] 16 (01/16 0845) BP: (119-139)/(56-97) 119/56 mmHg (01/16 0845) SpO2:  [98 %-99 %] 99 % (01/16 0444) Weight:  [115.667 kg (255 lb)] 115.667 kg (255 lb) (01/16 0144)  Physical Exam:  General: alert Lochia: appropriate Uterine Fundus: firm Perineum: Clear DVT Evaluation: No evidence of DVT seen on physical exam. Negative Homan's sign.   CBC Latest Ref Rng 06/23/2015 06/16/2015 07/12/2013  WBC 4.0 - 10.5 K/uL 8.5 8.5 13.3(H)  Hemoglobin 12.0 - 15.0 g/dL 16.112.3 09.612.2 11.2(L)  Hematocrit 36.0 - 46.0 % 36.0 35.8(L) 32.8(L)  Platelets 150 - 400 K/uL 278 279 279     Assessment/Plan: Status post vaginal delivery day 0. Stable Desires pp BTL. Continue current care. BTL consent obtained from office and placed in shadow chart. Signed 05/05/15. R&B of BTL reviewed with patient, including option of salpingectomy. Will make NPO after MN for tubal tomorrow--Dr. Rivard aware, and will post for Dr. Stefano GaulStringer for tomorrow. Maintain IV access.   Nyra CapesLATHAM, VICKICNM 06/23/2015, 10:13 AM

## 2015-06-23 NOTE — Lactation Note (Signed)
This note was copied from the chart of [name]. Lactation Consultation Note  Observed mom latch baby easily in cross cradle hold.  Baby nursed actively.  Reviewed basics.  No questions or concerns at present.  Patient Name: Tamara Boyce MediciLekessa Nole WUJWJ'XToday'[name] Date: 06/23/2015 Reason for consult: Initial assessment;Other (Comment) (per mom baby last fed at 0825 for 20 mins , mom encouraged to call with feeding cues , see LC note )   Maternal Data Does the patient have breastfeeding experience prior to this delivery?: Yes  Feeding Feeding Type: Breast Fed Length of feed: 20 min  LATCH Score/Interventions Latch: Grasps breast easily, tongue down, lips flanged, rhythmical sucking.  Audible Swallowing: Spontaneous and intermittent  Type of Nipple: Everted at rest and after stimulation  Comfort (Breast/Nipple): Soft / non-tender     Hold (Positioning): No assistance needed to correctly position infant at breast.  LATCH Score: 10  Lactation Tools Discussed/Used     Consult Status Consult Status: Follow-up Date: 06/23/15 Follow-up type: In-patient    Huston FoleyMOULDEN, [name] 06/23/2015, 11:45 AM

## 2015-06-24 ENCOUNTER — Encounter (HOSPITAL_COMMUNITY): Payer: Self-pay | Admitting: Certified Registered Nurse Anesthetist

## 2015-06-24 ENCOUNTER — Inpatient Hospital Stay (HOSPITAL_COMMUNITY): Payer: Medicaid Other | Admitting: Anesthesiology

## 2015-06-24 ENCOUNTER — Encounter (HOSPITAL_COMMUNITY)
Admission: AD | Disposition: A | Discharge status: Home / Self Care | Payer: Self-pay | Source: Ambulatory Visit | Attending: Obstetrics & Gynecology

## 2015-06-24 HISTORY — PX: TUBAL LIGATION: SHX77

## 2015-06-24 LAB — CBC
HCT: 33.4 % — ABNORMAL LOW (ref 36.0–46.0)
Hemoglobin: 11.2 g/dL — ABNORMAL LOW (ref 12.0–15.0)
MCH: 29 pg (ref 26.0–34.0)
MCHC: 33.5 g/dL (ref 30.0–36.0)
MCV: 86.5 fL (ref 78.0–100.0)
PLATELETS: 242 10*3/uL (ref 150–400)
RBC: 3.86 MIL/uL — ABNORMAL LOW (ref 3.87–5.11)
RDW: 14.8 % (ref 11.5–15.5)
WBC: 8.3 10*3/uL (ref 4.0–10.5)

## 2015-06-24 SURGERY — LIGATION, FALLOPIAN TUBE, POSTPARTUM
Anesthesia: Spinal | Site: Abdomen | Laterality: Bilateral

## 2015-06-24 MED ORDER — PROMETHAZINE HCL 25 MG/ML IJ SOLN: 6.2500 mg | INTRAMUSCULAR | Status: DC | PRN

## 2015-06-24 MED ORDER — PROPOFOL 10 MG/ML IV BOLUS
INTRAVENOUS | Status: AC
  Filled 2015-06-24: qty 20

## 2015-06-24 MED ORDER — LACTATED RINGERS IV SOLN: INTRAVENOUS | Status: DC

## 2015-06-24 MED ORDER — MUPIROCIN 2 % EX OINT
TOPICAL_OINTMENT | Freq: Two times a day (BID) | CUTANEOUS | Status: DC
  Administered 2015-06-24: 14:00:00 via NASAL
  Filled 2015-06-24: qty 22

## 2015-06-24 MED ORDER — LIDOCAINE HCL (CARDIAC) 20 MG/ML IV SOLN
INTRAVENOUS | Status: DC | PRN
  Administered 2015-06-24: 40 mg via INTRATRACHEAL

## 2015-06-24 MED ORDER — OXYCODONE HCL 5 MG PO TABS
5.0000 mg | ORAL_TABLET | ORAL | Status: DC | PRN
  Administered 2015-06-25 (×2): 5 mg via ORAL
  Filled 2015-06-24 (×3): qty 1

## 2015-06-24 MED ORDER — MIDAZOLAM HCL 2 MG/2ML IJ SOLN
INTRAMUSCULAR | Status: AC
  Filled 2015-06-24: qty 2

## 2015-06-24 MED ORDER — MIDAZOLAM HCL 2 MG/2ML IJ SOLN
INTRAMUSCULAR | Status: DC | PRN
  Administered 2015-06-24: 1 mg via INTRAVENOUS
  Administered 2015-06-24 (×2): 0.5 mg via INTRAVENOUS

## 2015-06-24 MED ORDER — ONDANSETRON HCL 4 MG/2ML IJ SOLN
INTRAMUSCULAR | Status: AC
  Filled 2015-06-24: qty 2

## 2015-06-24 MED ORDER — FENTANYL CITRATE (PF) 100 MCG/2ML IJ SOLN: 25.0000 ug | INTRAMUSCULAR | Status: DC | PRN

## 2015-06-24 MED ORDER — METOCLOPRAMIDE HCL 10 MG PO TABS
10.0000 mg | ORAL_TABLET | Freq: Once | ORAL | Status: AC
  Administered 2015-06-24: 10 mg via ORAL
  Filled 2015-06-24: qty 1

## 2015-06-24 MED ORDER — LIDOCAINE HCL (CARDIAC) 20 MG/ML IV SOLN
INTRAVENOUS | Status: AC
  Filled 2015-06-24: qty 5

## 2015-06-24 MED ORDER — BUPIVACAINE IN DEXTROSE 0.75-8.25 % IT SOLN
INTRATHECAL | Status: DC | PRN
  Administered 2015-06-24: 1.3 mL via INTRATHECAL

## 2015-06-24 MED ORDER — PROPOFOL 500 MG/50ML IV EMUL
INTRAVENOUS | Status: AC
  Filled 2015-06-24: qty 50

## 2015-06-24 MED ORDER — MEPERIDINE HCL 25 MG/ML IJ SOLN: 6.2500 mg | INTRAMUSCULAR | Status: DC | PRN

## 2015-06-24 MED ORDER — BUPIVACAINE-EPINEPHRINE 0.5% -1:200000 IJ SOLN
INTRAMUSCULAR | Status: DC | PRN
  Administered 2015-06-24: 7 mL

## 2015-06-24 MED ORDER — PROPOFOL 500 MG/50ML IV EMUL
INTRAVENOUS | Status: DC | PRN
  Administered 2015-06-24: 50 ug/kg/min via INTRAVENOUS

## 2015-06-24 MED ORDER — FAMOTIDINE 20 MG PO TABS
40.0000 mg | ORAL_TABLET | Freq: Once | ORAL | Status: AC
  Administered 2015-06-24: 40 mg via ORAL
  Filled 2015-06-24: qty 2

## 2015-06-24 MED ORDER — ONDANSETRON HCL 4 MG/2ML IJ SOLN
INTRAMUSCULAR | Status: DC | PRN
  Administered 2015-06-24: 4 mg via INTRAVENOUS

## 2015-06-24 SURGICAL SUPPLY — 26 items
CHLORAPREP W/TINT 26ML (MISCELLANEOUS) ×3 IMPLANT
CLOTH BEACON ORANGE TIMEOUT ST (SAFETY) ×3 IMPLANT
CONTAINER PREFILL 10% NBF 15ML (MISCELLANEOUS) ×6 IMPLANT
DRSG OPSITE POSTOP 3X4 (GAUZE/BANDAGES/DRESSINGS) ×3 IMPLANT
ELECT REM PT RETURN 9FT ADLT (ELECTROSURGICAL) ×3
ELECTRODE REM PT RTRN 9FT ADLT (ELECTROSURGICAL) ×1 IMPLANT
GLOVE BIOGEL PI IND STRL 7.0 (GLOVE) ×1 IMPLANT
GLOVE BIOGEL PI IND STRL 8.5 (GLOVE) ×1 IMPLANT
GLOVE BIOGEL PI INDICATOR 7.0 (GLOVE) ×2
GLOVE BIOGEL PI INDICATOR 8.5 (GLOVE) ×2
GLOVE ECLIPSE 8.0 STRL XLNG CF (GLOVE) ×6 IMPLANT
GOWN STRL REUS W/TWL 2XL LVL3 (GOWN DISPOSABLE) ×3 IMPLANT
GOWN STRL REUS W/TWL LRG LVL3 (GOWN DISPOSABLE) ×6 IMPLANT
NEEDLE HYPO 22GX1.5 SAFETY (NEEDLE) ×3 IMPLANT
NS IRRIG 1000ML POUR BTL (IV SOLUTION) ×3 IMPLANT
PACK ABDOMINAL MINOR (CUSTOM PROCEDURE TRAY) ×3 IMPLANT
PENCIL BUTTON HOLSTER BLD 10FT (ELECTRODE) ×3 IMPLANT
SPONGE LAP 4X18 X RAY DECT (DISPOSABLE) ×3 IMPLANT
SUT MNCRL AB 3-0 PS2 27 (SUTURE) ×3 IMPLANT
SUT PLAIN 0 NONE (SUTURE) ×3 IMPLANT
SUT VIC AB 2-0 CT1 27 (SUTURE) ×3
SUT VIC AB 2-0 CT1 TAPERPNT 27 (SUTURE) ×1 IMPLANT
SYR CONTROL 10ML LL (SYRINGE) ×3 IMPLANT
TOWEL OR 17X24 6PK STRL BLUE (TOWEL DISPOSABLE) ×6 IMPLANT
TRAY FOLEY CATH SILVER 14FR (SET/KITS/TRAYS/PACK) ×3 IMPLANT
WATER STERILE IRR 1000ML POUR (IV SOLUTION) ×3 IMPLANT

## 2015-06-24 NOTE — Anesthesia Postprocedure Evaluation (Signed)
Anesthesia Post Note  Patient: [name]  Procedure(s) Performed: Procedure(s) (LRB): POST PARTUM TUBAL LIGATION (Bilateral)  Patient location during evaluation: PACU Anesthesia Type: Spinal Level of consciousness: awake Pain management: pain level controlled Vital Signs Assessment: post-procedure vital signs reviewed and stable Respiratory status: spontaneous breathing Cardiovascular status: stable Postop Assessment: no headache, no backache, spinal receding, patient able to bend at knees and no signs of nausea or vomiting Anesthetic complications: no    Last Vitals:  Filed Vitals:   06/24/15 1615 06/24/15 1630  BP: 121/75 125/80  Pulse: 78 77  Temp:    Resp: 20 18    Last Pain:  Filed Vitals:   06/24/15 1638  PainSc: 0-No pain    LLE Motor Response: Purposeful movement (06/24/15 1630)   RLE Motor Response: Purposeful movement (06/24/15 1630)        Ilissa Rosner JR,JOHN Caton Popowski

## 2015-06-24 NOTE — Anesthesia Preprocedure Evaluation (Signed)
Anesthesia Evaluation  Patient identified by MRN, date of birth, ID band Patient awake    Reviewed: Allergy & Precautions, H&P , NPO status , Patient's Chart, lab work & pertinent test results, reviewed documented beta blocker date and time   History of Anesthesia Complications Negative for: history of anesthetic complications  Airway Mallampati: III  TM Distance: >3 FB Neck ROM: full    Dental  (+) Teeth Intact   Pulmonary neg pulmonary ROS, former smoker,    breath sounds clear to auscultation       Cardiovascular negative cardio ROS   Rhythm:regular Rate:Normal     Neuro/Psych Anxiety Depression negative neurological ROS     GI/Hepatic negative GI ROS, Neg liver ROS,   Endo/Other  Morbid obesity  Renal/GU negative Renal ROS     Musculoskeletal   Abdominal   Peds  Hematology negative hematology ROS (+)   Anesthesia Other Findings   Reproductive/Obstetrics                             Anesthesia Physical  Anesthesia Plan  ASA: III  Anesthesia Plan: Spinal   Post-op Pain Management:    Induction:   Airway Management Planned: Natural Airway  Additional Equipment:   Intra-op Plan:   Post-operative Plan:   Informed Consent: I have reviewed the patients History and Physical, chart, labs and discussed the procedure including the risks, benefits and alternatives for the proposed anesthesia with the patient or authorized representative who has indicated his/her understanding and acceptance.   Dental advisory given  Plan Discussed with:   Anesthesia Plan Comments:         Anesthesia Quick Evaluation

## 2015-06-24 NOTE — Discharge Summary (Signed)
OB Discharge Summary   Patient Name: [name] DOB: 07-09-1985 MRN: [id number]  Date of admission: 06/23/2015 Delivering MD: Sherre Scarlet   Date of discharge: 06/25/2015  Admitting diagnosis: 38 WEEKS ROM CTX desires sterilization Intrauterine pregnancy: [redacted]w[redacted]d  Secondary diagnosis: Principal Problem:  Vaginal delivery Active Problems:  GBS bacteriuria  History of depression  History of anxiety  History of PCOS  Morbid obesity (HCC)  Postpartum tubal ligation planned  Genital herpes  History of prior pregnancy with SGA newborn x2  History of gestational hypertension - first delivery - no meds  H/O migraine  Additional problems: None  Discharge diagnosis: Term Pregnancy Delivered, PP BTL    Post partum procedures:postpartum tubal ligation  Augmentation: None  Complications: None  Hospital course: Onset of Labor With Vaginal Delivery 30 y.o. yo [id number] at [redacted]w[redacted]d was admitted in Active Labor on 06/23/2015. Patient had an uncomplicated labor course as follows:  Membrane Rupture Time/Date: 11:50 PM ,06/22/2015   Intrapartum Procedures: Episiotomy: None [1]   Lacerations: None [1]  Patient had a delivery of a Viable infant. 06/23/2015  Information for the patient's newborn:  Tamara, [name] [[id number]]  Delivery Method: Vaginal, Spontaneous Delivery (Filed from Delivery Summary)    Pateint had an uncomplicated postpartum course. She is ambulating, tolerating a regular diet, passing flatus, and urinating well. Patient is discharged home in stable condition on 06/25/2015.    Physical exam  Filed Vitals:   06/24/15 1844 06/24/15 2145 06/25/15 0130 06/25/15 0545  BP: 116/74 121/78 118/67 126/76  Pulse: 76 82 86 74  Temp: 98.2 F  (36.8 C) 98.1 F (36.7 C) 98.1 F (36.7 C) 98.1 F (36.7 C)  TempSrc: Oral Oral Oral Oral  Resp: Height:      Weight:      SpO2: 99%      General: alert Lochia: appropriate Uterine Fundus: firm Incision: Perineum clear DVT Evaluation: No evidence of DVT seen on physical exam. Negative Homan's sign.  Labs:  CBC Latest Ref Rng 06/24/2015 06/23/2015 06/16/2015  WBC 4.0 - 10.5 K/uL 8.3 8.5 8.5  Hemoglobin 12.0 - 15.0 g/dL 11.2(L) 12.3 12.2  Hematocrit 36.0 - 46.0 % 33.4(L) 36.0 35.8(L)  Platelets 150 - 400 K/uL 242 278 279    Discharge instruction: per After Visit Summary and "Baby and Me Booklet", pp BTL instructions.  After visit meds:    Medication List    STOP taking these medications       valACYclovir 500 MG tablet  Commonly known as: VALTREX      TAKE these medications       acetaminophen 500 MG tablet  Commonly known as: TYLENOL  Take 1,000 mg by mouth every 6 (six) hours as needed for mild pain.     calcium carbonate 500 MG chewable tablet  Commonly known as: TUMS - dosed in mg elemental calcium  Chew 2 tablets by mouth 2 (two) times daily as needed for indigestion or heartburn.     ibuprofen 600 MG tablet  Commonly known as: ADVIL,MOTRIN  Take 1 tablet (600 mg total) by mouth every 6 (six) hours as needed.     oxyCODONE 5 MG immediate release tablet  Commonly known as: Oxy IR/ROXICODONE  Take 1 tablet (5 mg total) by mouth every 4 (four) hours as needed for moderate pain.     prenatal multivitamin Tabs tablet  Take 1 tablet by mouth daily.        Diet: routine diet  Activity: Advance  as tolerated. Pelvic rest for 6 weeks.   Outpatient follow up:6 weeks Follow up Appt:No future appointments. Follow up Visit:No Follow-up on file.  Postpartum contraception: Tubal Ligation via Modified Pomeroy method  Newborn Data: Live born female   Birth Weight: 8 lb 6.4 oz (3810 g) APGAR: 8, 9  Baby Feeding: Breast Disposition:rooming in at present due to jaundice, on bili blanket   06/25/2015 [name], CNM

## 2015-06-24 NOTE — Progress Notes (Signed)
Post Partum Day 1 Subjective: no complaints, up ad lib, voiding, tolerating PO and + flatus  Objective: Blood pressure 129/77, pulse 79, temperature 98.2 F (36.8 C), temperature source Oral, resp. rate 18, height  (1.651 m), weight 255 lb (115.667 kg), SpO2 99 %, unknown if currently breastfeeding.  Physical Exam:  General: alert and no distress Lochia: appropriate Uterine Fundus: firm Incision: NA DVT Evaluation: No evidence of DVT seen on physical exam.   Recent Labs  06/23/15 0120 06/24/15 0535  HGB 12.3 11.2*  HCT 36.0 33.4*    Assessment/Plan: Plan for discharge tomorrow, Breastfeeding and Contraception PP BTL today. R and B reviewed.   LOS: 1 day   Kodi Steil V 06/24/2015, 7:58 AM

## 2015-06-24 NOTE — Lactation Note (Signed)
This note was copied from the chart of [name]. Lactation Consultation Note  Patient Name: [name] Date: 06/24/2015 Reason for consult: Follow-up assessment;Hyperbilirubinemia  Baby is 63 hours old and is on double photo tx . @ the start of the consult baby already latched  On with depth and mom independent with latch and both lights intact. Swallows noted and baby sustained  Latch for 14 mins and fell asleep.  LC updated doc flow sheets per mom and dad since 0400 feeding.   Review of doc flow sheets - 4% weight loss , Serum Bili= 10.2, OA incompatibility. Breast feeding range X12 - 14 - 35 mins , Latch scores 10-10-8  2 voids , 4 stools. Reminded parents to check the waist band of the diaper for wets. Discussed with mom and dad due to baby being on double photo for jaundice and also to enhance fatty milk coming  In quicker post - pumping would be highly recommended and with EBM baby would be supplemented back with spoon, curved tip syringe , or SNS . The extra calories would give increase wets and stooling , therefore bring down the Bili level quicker.  LC set up the DEBP with instructions of set up , cleaning , and storage of breast milk. Mom is waiting to go for a BTL, is NPO, and plans to pump either before surgery or after.   Mom and dad receptive to teaching.    Maternal Data    Feeding Feeding Type: Breast Fed Length of feed: 14 min (LC observed swallows )  LATCH Score/Interventions Latch:  (latched  with depth )  Audible Swallowing:  (swallows noted )     Comfort (Breast/Nipple):  (per mom comfortable )     Hold (Positioning):  (mom independent with latch )     Lactation Tools Discussed/Used Tools: Pump Breast pump type: Double-Electric Breast Pump Pump Review: Setup, frequency, and cleaning;Milk Storage Initiated by:: MAI  Date initiated:: 06/24/15   Consult Status Consult Status: Follow-up Date: 06/24/15 Follow-up type:  In-patient    [name] 06/24/2015, 2:07 PM

## 2015-06-24 NOTE — Anesthesia Postprocedure Evaluation (Signed)
Anesthesia Post Note  Patient: [name]  Procedure(s) Performed: Procedure(s) (LRB): POST PARTUM TUBAL LIGATION (Bilateral)  Patient location during evaluation: Mother Baby Anesthesia Type: Spinal Level of consciousness: awake and alert Pain management: pain level controlled Vital Signs Assessment: post-procedure vital signs reviewed and stable Respiratory status: spontaneous breathing Cardiovascular status: stable Postop Assessment: no headache, no backache, no signs of nausea or vomiting and adequate PO intake Anesthetic complications: no    Last Vitals:  Filed Vitals:   06/24/15 1730 06/24/15 1745  BP: 114/62 123/84  Pulse: 75 72  Temp: 36.7 C 36.7 C  Resp: 20 18    Last Pain:  Filed Vitals:   06/24/15 1817  PainSc: 3                  Raymonda Pell Hristova

## 2015-06-24 NOTE — Anesthesia Postprocedure Evaluation (Signed)
Anesthesia Post Note  Patient: [name]  Procedure(s) Performed: Procedure(s) (LRB): POST PARTUM TUBAL LIGATION (Bilateral)  Patient location during evaluation: Mother Baby Anesthesia Type: Spinal Level of consciousness: awake and alert Pain management: pain level controlled Vital Signs Assessment: post-procedure vital signs reviewed and stable Respiratory status: spontaneous breathing Cardiovascular status: blood pressure returned to baseline and stable Postop Assessment: no headache, no backache, patient able to bend at knees and no signs of nausea or vomiting Anesthetic complications: no    Last Vitals:  Filed Vitals:   06/24/15 1844 06/24/15 2145  BP: 116/74 121/78  Pulse: 76 82  Temp: 36.8 C 36.7 C  Resp: 20 18    Last Pain:  Filed Vitals:   06/24/15 2248  PainSc: 0-No pain                 Leanord Thibeau

## 2015-06-24 NOTE — Addendum Note (Signed)
Addendum  created 06/24/15 1836 by Elgie Congo, CRNA   Modules edited: Clinical Notes   Clinical Notes:  File: [id number]

## 2015-06-24 NOTE — Addendum Note (Signed)
Addendum  created 06/24/15 2340 by Janeece Agee, CRNA   Modules edited: Clinical Notes   Clinical Notes:  File: [id number]; Pend: [id number]

## 2015-06-24 NOTE — Op Note (Signed)
OPERATIVE NOTE   [name]   DOB: Aug 11, 1985   MRN: [id number]   CSN: [id number]   Date of Surgery: 06/24/2015   Preoperative Diagnosis:   Postpartum day # 1  Desires Sterilization   Postoperative Diagnosis:   Same   Procedure:   Modified Pomeroy Postpartum Bilateral Tubal Sterilization Procedure   Surgeon:   Leonard Schwartz, M.D.   Assistant:   None   Anesthetic:   Spinal  Disposition:   The patient is a 30 y.o.-year-old female, now [id number], who desires sterilization. She understands the indications for surgical procedure. She accepts the risk of, but not limited to, anesthetic complications, bleeding, infections, possible damage to the surrounding organs, and possible tubal failure (17 per 1000).   Findings:   The fallopian tubes were normal bilaterally.   Procedure:   The patient was taken to the operating room where spinal anesthesia was noted to be adequate for surgery. The patient's abdomen was prepped with multiple layers of ChloraPrep. A foley catheter was placed after sterile preparation. She was then sterilely draped. The subumbilical area was injected with half percent Marcaine with epinephrine. A subumbilical incision was made and carried sharply through the subcutaneous tissue, the fascia, and the anterior peritoneum. The left fallopian tube was identified and followed to its fimbriated end. A knuckle of tube was made on the left using a free tie and individual ties of 0 plain catgut suture. The knuckle of tube was then excised. Hemostasis was adequate. An identical procedure was carried out on the opposite side. Again hemostasis was adequate. The fascia and the anterior peritoneum were closed using a running suture of 2-0 Vicryl. The skin was reapproximated using a subcuticular suture of 3-0 Monocryl. Sponge, needle, and instrument counts were correct on 2 occasions. The estimated blood loss was 2 cc's. The patient tolerated her procedure well.  She was transported to the recovery room in stable condition. The cut portions of the fallopian tubes were sent to pathology.   Leonard Schwartz, M.D.

## 2015-06-24 NOTE — Transfer of Care (Signed)
Immediate Anesthesia Transfer of Care Note  Patient: [name]  Procedure(s) Performed: Procedure(s): POST PARTUM TUBAL LIGATION (Bilateral)  Patient Location: PACU  Anesthesia Type:Spinal  Level of Consciousness: awake, alert , oriented and patient cooperative  Airway & Oxygen Therapy: Patient Spontanous Breathing and Patient connected to nasal cannula oxygen  Post-op Assessment: Report given to RN and Post -op Vital signs reviewed and stable  Post vital signs: Reviewed and stable  Last Vitals:  Filed Vitals:   06/24/15 0506 06/24/15 1426  BP: 129/77 120/74  Pulse: 79 75  Temp: 36.8 C 36.7 C  Resp: 18 18    Complications: No apparent anesthesia complications

## 2015-06-24 NOTE — Clinical Social Work Maternal (Signed)
  CLINICAL SOCIAL WORK MATERNAL/CHILD NOTE  Patient Details  Name: CAPRIA CARTAYA MRN: [id number] Date of Birth: [birth year]  Date:  06/24/2015  Clinical Social Worker Initiating Note:  Loleta Books MSW, LCSW Date/ Time Initiated:  06/24/15/1115     Child's Name:  Arvil Persons   Legal Guardian:  Kae Heller and Darlin Priestly   Need for Interpreter:  None   Date of Referral:  06/23/15     Reason for Referral:  History of anxiety  Referral Source:  [facility]   Address:  [address] [place], [place] [address]  Phone number:  [phone]   Household Members:  Minor Children, Spouse   Natural Supports (not living in the home):  Immediate Family, Extended Family   Professional Supports: None   Employment:     Type of Work:     Education:      Architect:  [employer]   Other Resources:  Sales executive , [employer]   Cultural/Religious Considerations Which May Impact Care:  None reported  Strengths:  Ability to meet basic needs , Home prepared for child , Pediatrician chosen    Risk Factors/Current Problems:  None   Cognitive State:  Able to Concentrate , Alert , Goal Oriented , Linear Thinking    Mood/Affect:  Happy , Comfortable , Calm    CSW Assessment:  CSW received request for consult due to MOB presenting with a history of anxiety. MOB presented in a pleasant mood, displayed a full range in affect, and was observed to be breastfeeding and caring for the infant during the assessment.  MOB was noted to be calm, did not present with any acute anxiety, nor did her thought process exhibit any anxiety.  MOB reported history of anxiety and panic attacks (chest pain, difficulties breathing), approximately 3 years ago secondary to stressful work environment. Per MOB, once she quit her job, she had no additional anxiety or panic attacks. MOB denied history of anxiety secondary to parenting or during her perinatal period.  MOB expressed feelings of happiness and  excitement secondary to this infant's birth and transition to caring for three children. Per MOB, she and the FOB have a strong support system, and shared that she knows that she is not alone.  MOB stated that she began to prepare for the infant early in the pregnancy since she has a history of delivering infant's early.  MOB denied questions or concerns related to infant's phototherapy. She stated that she had hope for the infant to not have phototherapy, but shared that she knew to anticipate it since her two previous children have had jaundice. MOB reported that she is coping well with the phototherapy, and stated that the infant appears to be tolerating well. MOB recognized that it is only a short term medical intervention, and expressed gratitude that phototherapy can continue at home if needed.    MOB denied questions, concerns, or needs as she transitions postpartum. She stated that she is celebrating a healthy infant and her ability to give birth vaginally. She acknowledged ongoing role of CSW, and agreed to contact CSW if needs arise.  CSW Plan/Description:   1. Patient/Family Education-- perinatal mood and anxiety disorders 2. No Further Intervention Required/No Barriers to Discharge    Kelby Fam 06/24/2015, 11:44 AM

## 2015-06-24 NOTE — Anesthesia Procedure Notes (Signed)
Spinal Patient location during procedure: OR Staffing Anesthesiologist: Pascuala Klutts Performed by: anesthesiologist  Preanesthetic Checklist Completed: patient identified, site marked, surgical consent, pre-op evaluation, timeout performed, IV checked, risks and benefits discussed and monitors and equipment checked Spinal Block Patient position: sitting Prep: DuraPrep Patient monitoring: heart rate, continuous pulse ox and blood pressure Approach: right paramedian Location: L3-4 Injection technique: single-shot Needle Needle type: Sprotte  Needle gauge: 24 G Needle length: 9 cm Additional Notes Expiration date of kit checked and confirmed. Patient tolerated procedure well, without complications.     

## 2015-06-25 ENCOUNTER — Encounter (HOSPITAL_COMMUNITY): Payer: Self-pay | Admitting: Obstetrics and Gynecology

## 2015-06-25 MED ORDER — IBUPROFEN 600 MG PO TABS: 600.0000 mg | ORAL_TABLET | Freq: Four times a day (QID) | ORAL | Status: DC | PRN

## 2015-06-25 MED ORDER — OXYCODONE HCL 5 MG PO TABS: 5.0000 mg | ORAL_TABLET | ORAL | Status: DC | PRN

## 2015-06-25 NOTE — Discharge Summary (Signed)
OB Discharge Summary   Patient Name: [name] DOB: 08/02/1985 MRN: [id number]  Date of admission: 06/23/2015 Delivering MD: WILLIAMS, KIMBERLY   Date of discharge: 06/25/2015  Admitting diagnosis: 38 WEEKS ROM CTX desires sterilization Intrauterine pregnancy: [redacted]w[redacted]d  Secondary diagnosis: Principal Problem:  Vaginal delivery Active Problems:  GBS bacteriuria  History of depression  History of anxiety  History of PCOS  Morbid obesity (HCC)  Postpartum tubal ligation planned  Genital herpes  History of prior pregnancy with SGA newborn x2  History of gestational hypertension - first delivery - no meds  H/O migraine  Additional problems: None  Discharge diagnosis: Term Pregnancy Delivered, PP BTL    Post partum procedures:postpartum tubal ligation  Augmentation: None  Complications: None  Hospital course: Onset of Labor With Vaginal Delivery 29 y.o. yo G3P2103 at [redacted]w[redacted]d was admitted in Active Labor on 06/23/2015. Patient had an uncomplicated labor course as follows:  Membrane Rupture Time/Date: 11:50 PM ,06/22/2015   Intrapartum Procedures: Episiotomy: None [1]   Lacerations: None [1]  Patient had a delivery of a Viable infant. 06/23/2015  Information for the patient's newborn:  Gundrum, Boy Maisley [[id number]]  Delivery Method: Vaginal, Spontaneous Delivery (Filed from Delivery Summary)    Pateint had an uncomplicated postpartum course. She is ambulating, tolerating a regular diet, passing flatus, and urinating well. Patient is discharged home in stable condition on 06/25/2015.    Physical exam  Filed Vitals:   06/24/15 1844 06/24/15 2145 06/25/15 0130 06/25/15 0545  BP: 116/74 121/78 118/67 126/76  Pulse: 76 82 86 74  Temp: 98.2 F  (36.8 C) 98.1 F (36.7 C) 98.1 F (36.7 C) 98.1 F (36.7 C)  TempSrc: Oral Oral Oral Oral  Resp: 20 18 18 18  Height:      Weight:      SpO2: 99%      General: alert Lochia: appropriate Uterine Fundus: firm Incision: Perineum clear DVT Evaluation: No evidence of DVT seen on physical exam. Negative Homan's sign.  Labs:  CBC Latest Ref Rng 06/24/2015 06/23/2015 06/16/2015  WBC 4.0 - 10.5 K/uL 8.3 8.5 8.5  Hemoglobin 12.0 - 15.0 g/dL 11.2(L) 12.3 12.2  Hematocrit 36.0 - 46.0 % 33.4(L) 36.0 35.8(L)  Platelets 150 - 400 K/uL 242 278 279    Discharge instruction: per After Visit Summary and "Baby and Me Booklet", pp BTL instructions.  After visit meds:    Medication List    STOP taking these medications       valACYclovir 500 MG tablet  Commonly known as: VALTREX      TAKE these medications       acetaminophen 500 MG tablet  Commonly known as: TYLENOL  Take 1,000 mg by mouth every 6 (six) hours as needed for mild pain.     calcium carbonate 500 MG chewable tablet  Commonly known as: TUMS - dosed in mg elemental calcium  Chew 2 tablets by mouth 2 (two) times daily as needed for indigestion or heartburn.     ibuprofen 600 MG tablet  Commonly known as: ADVIL,MOTRIN  Take 1 tablet (600 mg total) by mouth every 6 (six) hours as needed.     oxyCODONE 5 MG immediate release tablet  Commonly known as: Oxy IR/ROXICODONE  Take 1 tablet (5 mg total) by mouth every 4 (four) hours as needed for moderate pain.     prenatal multivitamin Tabs tablet  Take 1 tablet by mouth daily.        Diet: routine diet  Activity: Advance   as tolerated. Pelvic rest for 6 weeks.   Outpatient follow up:6 weeks Follow up Appt:No future appointments. Follow up Visit:No Follow-up on file.  Postpartum contraception: Tubal Ligation via Modified [place] method  Newborn Data: Live born female   Birth Weight: 8 lb 6.4 oz (3810 g) APGAR: 8, 9  Baby Feeding: Breast Disposition:rooming in at present due to jaundice, on bili blanket   06/25/2015 Reginna Sermeno, CNM      

## 2015-06-25 NOTE — Lactation Note (Signed)
This note was copied from the chart of Tamara Carrisa Croswell. Lactation Consultation Note  Patient Name: [name] Date: 06/25/2015 Reason for consult: Follow-up assessment;Hyperbilirubinemia;Other (Comment) (double photo decreased down to one this am )  Baby is 64 hours old and is exclusively breast feeding. Per mom  Milk is in and was able to pump off 3 oz. Per mom when baby is latched is able to  Stay latched and maintains an active pattern. Per mom denies soreness, or engorgement. LC praised mom for her consistent efforts breast feeding  And reassured mom even though baby is now the patient and she isn't that the [facility] RN and LC is here to support baby's feedings , also if she has any challenges  With engorgement to communicate needs for ice packs. Also due to 7% weight loss, feed the baby at least every 3 hours and with feeding cues skin to skin and with lights. If the baby gets sluggish with latch - may need to use SNS  With EBM for extra calories. MBU RN or LC would beable to assist with the SNS at the latch. DEBP at [facility] .   Maternal Data    Feeding Feeding Type:  (per mom baby last fed at 1230p , plans to feed by 1530 , milk in ) Length of feed: 15 min (per mom )  LATCH Score/Interventions                Intervention(s): Breastfeeding basics reviewed     Lactation Tools Discussed/Used Tools: Pump Breast pump type: Double-Electric Breast Pump (permom pumping off 3 oz )   Consult Status Consult Status: Follow-up Date: 06/26/15 Follow-up type: In-patient    [name] 06/25/2015, 3:16 PM

## 2015-06-25 NOTE — Discharge Instructions (Signed)
Postpartum Care After Vaginal Delivery °After you deliver your newborn (postpartum period), the usual stay in the hospital is 24-72 hours. If there were problems with your labor or delivery, or if you have other medical problems, you might be in the hospital longer.  °While you are in the hospital, you will receive help and instructions on how to care for yourself and your newborn during the postpartum period.  °While you are in the hospital: °· Be sure to tell your nurses if you have pain or discomfort, as well as where you feel the pain and what makes the pain worse. °· If you had an incision made near your vagina (episiotomy) or if you had some tearing during delivery, the nurses may put ice packs on your episiotomy or tear. The ice packs may help to reduce the pain and swelling. °· If you are breastfeeding, you may feel uncomfortable contractions of your uterus for a couple of weeks. This is normal. The contractions help your uterus get back to normal size. °· It is normal to have some bleeding after delivery. °· For the first 1-3 days after delivery, the flow is red and the amount may be similar to a period. °· It is common for the flow to start and stop. °· In the first few days, you may pass some small clots. Let your nurses know if you begin to pass large clots or your flow increases. °· Do not  flush blood clots down the toilet before having the nurse look at them. °· During the next 3-10 days after delivery, your flow should become more watery and pink or brown-tinged in color. °· Ten to fourteen days after delivery, your flow should be a small amount of yellowish-white discharge. °· The amount of your flow will decrease over the first few weeks after delivery. Your flow may stop in 6-8 weeks. Most women have had their flow stop by 12 weeks after delivery. °· You should change your sanitary pads frequently. °· Wash your hands thoroughly with soap and water for at least 20 seconds after changing pads, using  the toilet, or before holding or feeding your newborn. °· You should feel like you need to empty your bladder within the first 6-8 hours after delivery. °· In case you become weak, lightheaded, or faint, call your nurse before you get out of bed for the first time and before you take a shower for the first time. °· Within the first few days after delivery, your breasts may begin to feel tender and full. This is called engorgement. Breast tenderness usually goes away within 48-72 hours after engorgement occurs. You may also notice milk leaking from your breasts. If you are not breastfeeding, do not stimulate your breasts. Breast stimulation can make your breasts produce more milk. °· Spending as much time as possible with your newborn is very important. During this time, you and your newborn can feel close and get to know each other. Having your newborn stay in your room (rooming in) will help to strengthen the bond with your newborn.  It will give you time to get to know your newborn and become comfortable caring for your newborn. °· Your hormones change after delivery. Sometimes the hormone changes can temporarily cause you to feel sad or tearful. These feelings should not last more than a few days. If these feelings last longer than that, you should talk to your caregiver. °· If desired, talk to your caregiver about methods of family planning or contraception. °·   Talk to your caregiver about immunizations. Your caregiver may want you to have the following immunizations before leaving the hospital: °· Tetanus, diphtheria, and pertussis (Tdap) or tetanus and diphtheria (Td) immunization. It is very important that you and your family (including grandparents) or others caring for your newborn are up-to-date with the Tdap or Td immunizations. The Tdap or Td immunization can help protect your newborn from getting ill. °· Rubella immunization. °· Varicella (chickenpox) immunization. °· Influenza immunization. You should  receive this annual immunization if you did not receive the immunization during your pregnancy. °  °This information is not intended to replace advice given to you by your health care provider. Make sure you discuss any questions you have with your health care provider. °  °Document Released: 03/21/2007 Document Revised: 02/16/2012 Document Reviewed: 01/19/2012 °Elsevier Interactive Patient Education ©2016 Else[employer] Inc[employer] ° °Postpartum Tubal Ligation, Care After °Refer to this sheet in the next few weeks. These instructions provide you with information about caring for yourself after your procedure. Your health care provider may also give you more specific instructions. Your treatment has been planned according to current medical practices, but problems sometimes occur. Call your health care provider if you have any problems or questions after your procedure. °WHAT TO EXPECT AFTER THE PROCEDURE °After your procedure, it is common to have: °· Sore throat. °· Soreness at the incision site. °· Mild cramping. °· Tiredness. °· Mild nausea or vomiting. °HOME CARE INSTRUCTIONS °· Rest for the remainder of the day. °· Take medicines only as directed by your health care provider. These include over-the-counter medicines and prescription medicines. Do not take aspirin, which can cause bleeding. °· Over the next few days, gradually return to your normal activities and your normal diet. °· Avoid sexual intercourse for 2 weeks or as directed by your health care provider. °· Do not drive or operate heavy machinery while taking pain medicine. °· Do not lift anything that is heavier than 5 lb (2.3 kg) for 2 weeks or as directed by your health care provider. °· Do not take baths. Take showers only. Ask your health care provider when you can start taking baths. °· Take your temperature twice each day and write it down. °· Try to have help for the first 7-10 days for your household needs. °· There are many different ways to close and  cover an incision, including stitches (sutures), skin glue, and adhesive strips. Follow instructions from your health care provider about: °¨ Incision care. °¨ Bandage (dressing) changes and removal. °¨ Incision closure removal. °· Check your incision area every day for signs of infection. Watch for: °¨ Redness, swelling, or pain. °¨ Fluid, blood, or pus. °· Keep all follow-up visits as directed by your health care provider. °SEEK MEDICAL CARE IF: °· You have redness, swelling, or increasing pain in your incision area. °· You have fluid or pus coming from your incision for longer than 1 day. °· You notice a bad smell coming from your incision or your dressing. °· The edges of your incision break open after the sutures have been removed. °· Your pain does not decrease after 2-3 days. °· You have a rash. °· You repeatedly become dizzy or light-headed. °· You have a reaction to your medicine. °· Your pain medicine is not helping. °· You are constipated. °SEEK IMMEDIATE MEDICAL CARE IF:  °· You have a fever. °· You faint. °· You have increasing pain in your abdomen. °· You have bleeding or drainage from your   suture sites or your vagina after surgery. °· You have shortness of breath or have difficulty breathing. °· You have chest pain or leg pain. °· You have ongoing nausea, vomiting, or diarrhea. °  °This information is not intended to replace advice given to you by your health care provider. Make sure you discuss any questions you have with your health care provider. °  °Document Released: 11/23/2011 Document Revised: 10/08/2014 Document Reviewed: 11/23/2011 °Elsevier Interactive Patient Education ©2016 Elsevier Inc. ° ° °

## 2015-06-26 ENCOUNTER — Ambulatory Visit: Payer: Self-pay

## 2015-06-26 NOTE — Lactation Note (Signed)
This note was copied from the chart of Tamara Enid Maultsby. Lactation Consultation Note  Mother latched baby in football hold w/ double phototherapy lights. Sucks and swallows observed.  Mother massages breast when baby slows. Encouraged her to continue to post pump and give baby back volume pumped until jaundice resolved. Mother currently pumping approx 90 ml. Mother has her own DEBP at home. Reviewed engorgement care and monitoring voids/stools.   Patient Name: [name] [id number]'U Date: 06/26/2015 Reason for consult: Follow-up assessment   Maternal Data    Feeding Feeding Type: Breast Fed  LATCH Score/Interventions Latch: Grasps breast easily, tongue down, lips flanged, rhythmical sucking.  Audible Swallowing: A few with stimulation  Type of Nipple: Everted at rest and after stimulation  Comfort (Breast/Nipple): Filling, red/small blisters or bruises, mild/mod discomfort  Problem noted: Filling  Hold (Positioning): No assistance needed to correctly position infant at breast.  LATCH Score: 8  Lactation Tools Discussed/Used     Consult Status Consult Status: Complete    [name] 06/26/2015, 11:15 AM

## 2016-02-16 ENCOUNTER — Emergency Department (HOSPITAL_COMMUNITY)
Admission: EM | Admit: 2016-02-16 | Discharge: 2016-02-16 | Disposition: A | Discharge status: Home / Self Care | Payer: Medicaid Other | Attending: Emergency Medicine | Admitting: Emergency Medicine

## 2016-02-16 ENCOUNTER — Encounter (HOSPITAL_COMMUNITY): Payer: Self-pay

## 2016-02-16 DIAGNOSIS — Z87891 Personal history of nicotine dependence: Secondary | ICD-10-CM | POA: Insufficient documentation

## 2016-02-16 DIAGNOSIS — R200 Anesthesia of skin: Secondary | ICD-10-CM | POA: Diagnosis present

## 2016-02-16 DIAGNOSIS — M62838 Other muscle spasm: Secondary | ICD-10-CM | POA: Diagnosis not present

## 2016-02-16 LAB — CBC WITH DIFFERENTIAL/PLATELET
Basophils Absolute: 0 10*3/uL (ref 0.0–0.1)
Basophils Relative: 0 %
Eosinophils Absolute: 0.6 10*3/uL (ref 0.0–0.7)
Eosinophils Relative: 6 %
HEMATOCRIT: 38.8 % (ref 36.0–46.0)
Hemoglobin: 12.8 g/dL (ref 12.0–15.0)
LYMPHS PCT: 31 %
Lymphs Abs: 3 10*3/uL (ref 0.7–4.0)
MCH: 28.2 pg (ref 26.0–34.0)
MCHC: 33 g/dL (ref 30.0–36.0)
MCV: 85.5 fL (ref 78.0–100.0)
MONO ABS: 0.5 10*3/uL (ref 0.1–1.0)
MONOS PCT: 5 %
NEUTROS ABS: 5.7 10*3/uL (ref 1.7–7.7)
Neutrophils Relative %: 58 %
Platelets: 319 10*3/uL (ref 150–400)
RBC: 4.54 MIL/uL (ref 3.87–5.11)
RDW: 13 % (ref 11.5–15.5)
WBC: 9.7 10*3/uL (ref 4.0–10.5)

## 2016-02-16 LAB — BASIC METABOLIC PANEL
Anion gap: 7 (ref 5–15)
BUN: 12 mg/dL (ref 6–20)
CALCIUM: 9.3 mg/dL (ref 8.9–10.3)
CO2: 26 mmol/L (ref 22–32)
CREATININE: 0.77 mg/dL (ref 0.44–1.00)
Chloride: 104 mmol/L (ref 101–111)
GFR calc Af Amer: >60 mL/min (ref >60)
GFR calc non Af Amer: >60 mL/min (ref >60)
GLUCOSE: 87 mg/dL (ref 65–99)
Potassium: 4 mmol/L (ref 3.5–5.1)
Sodium: 137 mmol/L (ref 135–145)

## 2016-02-16 MED ORDER — ACETAMINOPHEN 325 MG PO TABS
650.0000 mg | ORAL_TABLET | Freq: Once | ORAL | Status: AC
  Administered 2016-02-16: 650 mg via ORAL
  Filled 2016-02-16: qty 2

## 2016-02-16 NOTE — ED Triage Notes (Signed)
Per Pt, Pt is coming from home with complaints of left facial twitching that started three days ago.

## 2016-02-16 NOTE — ED Provider Notes (Signed)
[facility]-EMERGENCY DEPT Provider Note   CSN: [id number] Arrival date & time: 02/16/16  1226     History   Chief Complaint Chief Complaint  Patient presents with  . Spasms    HPI   [name] is a 30 y.o. female. Presents for left cheek twitching, occipital headache and left forearm and hand numbness that lasted < 5 min. She had a history of headaches typically controlled Tylenol. She is did not take Tylenol for this headache because she has been concerned about its effects on her breast-feeding her child. She additionally notes left eye soreness associated with this headache but denies blurry vision. Her left cheek twitching has been present occasionally for the last 3 days. This is not painful. Her left hand and forearm numbness occurred in the setting of resting her elbow on her car arm rest while playing a game on her phone. It resolved shortly after adjusting her arm. She denies any weakness, chest pain, shortness of breath Significantly she stays at home to care for her 5070-month-old child and 980-year-old child. The 1019 month old child sleeps in the same bed with her and her husband and she reports that she is not able to sleep throughout the night secondary to this. She feels her 29 year old child is stressful to care for given his " terrible twos" but she denies low mood. She denies caffeine use, last used alcohol yesterday and drank A can of beer, denies drug use.  Past Medical History:  Diagnosis Date  . Anxiety    no meds  . Depression    no meds  . PCOS (polycystic ovarian syndrome)     Patient Active Problem List   Diagnosis Date Noted  . GBS bacteriuria 06/23/2015  . History of depression 06/23/2015  . History of anxiety 06/23/2015  . History of PCOS 06/23/2015  . Morbid obesity (HCC) 06/23/2015  . Postpartum tubal ligation planned 06/23/2015  . Genital herpes 06/23/2015  . History of prior pregnancy with SGA newborn x2 06/23/2015  . History of gestational hypertension  - first delivery - no meds 06/23/2015  . H/O migraine 06/23/2015  . Vaginal delivery 06/23/2015    Past Surgical History:  Procedure Laterality Date  . TUBAL LIGATION Bilateral 06/24/2015   Procedure: POST PARTUM TUBAL LIGATION;  Surgeon: Kirkland HunArthur Stringer, MD;  Location: [facility];  Service: Gynecology;  Laterality: Bilateral;  . WISDOM TOOTH EXTRACTION      OB History    Gravida Para Term Preterm AB Living   3 3 2 1   3    SAB TAB Ectopic Multiple Live Births         0 3       Home Medications    Prior to Admission medications   Medication Sig Start Date End Date Taking? Authorizing Provider  acetaminophen (TYLENOL) 500 MG tablet Take 1,000 mg by mouth every 6 (six) hours as needed for mild pain.     Historical Provider, MD  calcium carbonate (TUMS - DOSED IN MG ELEMENTAL CALCIUM) 500 MG chewable tablet Chew 2 tablets by mouth 2 (two) times daily as needed for indigestion or heartburn.    Historical Provider, MD  ibuprofen (ADVIL,MOTRIN) 600 MG tablet Take 1 tablet (600 mg total) by mouth every 6 (six) hours as needed. 06/25/15   Nigel BridgemanVicki Latham, CNM  oxyCODONE (OXY IR/ROXICODONE) 5 MG immediate release tablet Take 1 tablet (5 mg total) by mouth every 4 (four) hours as needed for moderate pain. 06/25/15  [name], CNM  Prenatal Vit-Fe Fumarate-FA (PRENATAL MULTIVITAMIN) TABS Take 1 tablet by mouth daily.    Historical Provider, MD    Family History Family History  Problem Relation Age of Onset  . Hypertension Maternal Grandmother   . Diabetes Maternal Grandfather   . Diabetes Paternal Grandmother   . Hypertension Paternal Grandfather     Social History Social History  Substance Use Topics  . Smoking status: Former Smoker    Types: Cigarettes    Quit date: 06/16/2011  . Smokeless tobacco: Never Used  . Alcohol use Yes     Allergies   Review of patient's allergies indicates no known allergies.   Review of Systems Review of Systems  Constitutional: Negative.   HENT:  Negative.   Eyes: Positive for pain. Negative for photophobia, discharge, redness, itching and visual disturbance.  Respiratory: Negative.   Cardiovascular: Negative.   Gastrointestinal: Negative.   Genitourinary: Negative.   Musculoskeletal: Negative.   Neurological: Positive for numbness.     Physical Exam Updated Vital Signs BP 122/95   Pulse 76   Temp 97.7 F (36.5 C) (Oral)   Resp 16   Ht 5\' 4"  (1.626 m)   Wt 113.4 kg   SpO2 99%   BMI 42.91 kg/m   Physical Exam  Constitutional: She is oriented to person, place, and time. She appears well-developed and well-nourished. No distress.  HENT:  Head: Normocephalic and atraumatic.  Eyes: Conjunctivae and EOM are normal. Pupils are equal, round, and reactive to light.  Neck: Normal range of motion. Neck supple.  Cardiovascular: Normal rate, regular rhythm and normal heart sounds.   Pulmonary/Chest: Effort normal and breath sounds normal. No respiratory distress.  Abdominal: Soft. Bowel sounds are normal. She exhibits no distension. There is no tenderness.  Musculoskeletal: Normal range of motion.  Neurological: She is alert and oriented to person, place, and time. She displays normal reflexes. No cranial nerve deficit. She exhibits normal muscle tone. Coordination normal.  Skin: Skin is warm and dry.  Psychiatric: She has a normal mood and affect.     ED Treatments / Results  Labs (all labs ordered are listed, but only abnormal results are displayed) Labs Reviewed  CBC WITH DIFFERENTIAL/PLATELET  BASIC METABOLIC PANEL    EKG  EKG Interpretation None       Radiology No results found.  Procedures Procedures (including critical care time)  Medications Ordered in ED Medications  acetaminophen (TYLENOL) tablet 650 mg (650 mg Oral Given 02/16/16 1713)     Initial Impression / Assessment and Plan / ED Course  I have reviewed the triage vital signs and the nursing notes.  Pertinent labs & imaging results that  were available during my care of the patient were reviewed by me and considered in my medical decision making (see chart for details).  Clinical Course   30 y/o F presenting for headache, transient left forearm and hand numbness as well as left cheek twitching. Her headache improved with tylenol in the ED. EKG showed no evidence of ischemia and her left hand and arm numbness did not recur. These were likely due to nerve compression during arm positioning. Her left cheek continued to periodically twitch in the ED, however her chronic poor sleep habits and child care stresses were likely contributing. She was counseled on sleep hygiene, given information for PCPs and directed to follow with a primary care provider as an outpatient or return to the ED as needed for worsening symptoms    Final  Clinical Impressions(s) / ED Diagnoses   Final diagnoses:  Muscle spasm     New Prescriptions Discharge Medication List as of 02/16/2016  6:57 PM      ,Kree Rafter A. Kennon Rounds MD, MS Family Medicine Resident PGY-3 Pager [phone]    Bonney Aid, MD 02/16/16 4540    Marily Memos, MD 02/17/16 [phone]

## 2016-11-15 ENCOUNTER — Ambulatory Visit
Admission: RE | Admit: 2016-11-15 | Discharge: 2016-11-15 | Disposition: A | Discharge status: Home / Self Care | Payer: Medicaid Other | Source: Ambulatory Visit | Attending: Family Medicine | Admitting: Family Medicine

## 2016-11-15 ENCOUNTER — Other Ambulatory Visit: Payer: Self-pay | Admitting: Family Medicine

## 2016-11-15 DIAGNOSIS — M25571 Pain in right ankle and joints of right foot: Secondary | ICD-10-CM

## 2017-02-11 ENCOUNTER — Encounter: Payer: Medicaid Other | Attending: Family Medicine | Admitting: Registered"

## 2017-02-11 ENCOUNTER — Encounter: Payer: Self-pay | Admitting: Registered"

## 2017-02-11 DIAGNOSIS — Z6841 Body Mass Index (BMI) 40.0 and over, adult: Secondary | ICD-10-CM | POA: Diagnosis not present

## 2017-02-11 DIAGNOSIS — E669 Obesity, unspecified: Secondary | ICD-10-CM

## 2017-02-11 DIAGNOSIS — Z713 Dietary counseling and surveillance: Secondary | ICD-10-CM | POA: Diagnosis not present

## 2017-02-11 NOTE — Progress Notes (Signed)
Medical Nutrition Therapy:  Appt start time: 0940 end time:  1050.   Assessment:  Primary concerns today: Pt referred for weight management. Pt says she has been trying to lose weight over the past year and her weight has not decreased. Pt says she has PCOS and has been feeling depressed because she has been unable to lose weight. Pt says for the past week she has been trying to avoid carbohydrates and only eat meat and non-starchy vegetables to lose weight. Before following this diet pt says she did the Reuel Boom Diet for 21 days and lost 1 lb. Pt says she also recently found out she has GERD. Pt says she does not want to take medication for GERD due to concerns about possible side effects, but she has been trying to avoid foods that worsen her reflux including acidic and spicy foods, chocolate, fatty foods, and dairy. 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 Pt says she does not sleep well at night and is concerned she may have sleep apnea.   Preferred Learning Style:  No preference indicated   Learning Readiness:  Ready  MEDICATIONS: See list.    DIETARY INTAKE:  Usual eating pattern includes 3 meals and 2 snacks per day.  Pt says breakfast is often eaten standing up, lunch on the sofa, and dinner at the table with family.   Commonly eaten foods include: egg whites, spinach, [place] bacon and/or hot dog, chicken.  Avoided foods include foods with red sauce, spicy foods, chocolate, fatty foods, ribs, acidic foods, caffeine. Typical snacks include: kale chips, popcorn.      24-hr recall:  B ( AM):  3 boiled egg whites, 3 pieces of [place] bacon, kale chips  Snk ( AM): None reported.   L ( PM): cabbage, chicken Snk ( PM): 1 cup smart popcorn  D ( PM): tuna fish, romaine lettuce  Snk ( PM): None reported.  Beverages: water   Sometimes drinks caffeine free soda but not very often. Pt says she has been avoiding dairy because it worsens her GERD and has been drinking almond milk instead.    Usual physical activity: Sometimes will do workout videos but not regularly.   Progress Towards Goal(s):  In progress.   Nutritional Diagnosis:  NI-5.11.1 Predicted suboptimal nutrient intake As related to pt following a very low carbohydrate diet .  As evidenced by pt's reported dietary recall and habits.    Intervention:  Nutrition counseling provided. Dietitian provided education on balanced nutrition. Discussed the importance of eating foods from each food group to provide the body with all necessary nutrients. Provided education regarding mindful eating. Discussed that drinking soy milk instead of almond milk would provide pt with more protein, however, if she prefers the almond milk and gets in adequate protein elsewhere in her diet that is fine. Dietitian discussed focusing on overall health-getting in balanced nutrition, regular physical activity,etc. rather than focusing on weight and that losing weight is more difficult for women with PCOS. Dietitian provided nutrition therapy for GERD and encouraged pt to keep a food/symptom log to help assess foods that exacerbate her GERD. Dietitian encouraged pt to talk to her physician about having vitamin D level assessed as vitamin D deficiency is more common in women with PCOS. Dietitian encouraged pt to consume foods that provide good sources of omega 3 fatty acids such as salmon, tuna, and walnuts or pt could take a fish oil supplement as long as it does not worsen her  reflux to help reduce inflammation. Pt says she has not had any problems with eating fatty fish and has been thinking about trying a fish oil supplement to help reduce inflammation. Dietitian encouraged pt to include regular physical activity and discussed how going on regular walks with her kids could be a good way to get in more activity. Pt appeared agreeable to information/goals discussed.   Goals:   Continue getting in three meals per day. Try to eat balanced meals like the  plate example including carbohydrates in moderation as shown on the balanced plate.   Try to practice mindfulness with eating-try to eat at a slower pace, at a table and without distractions.   Recommend keeping a food and symptom journal to help assess which foods worsen reflux. Continue limiting/avoiding foods that worsen reflux symptoms.    Try to get include regular physical activity-try for a goal of at least 30 minutes of activity most days of the week or 150 minutes per week.  Teaching Method Utilized:  Visual Auditory  Handouts given during visit include:  Balanced plate with food list  GERD Nutrition Therapy   PCOS handout   Barriers to learning/adherence to lifestyle change: None indicated.   Demonstrated degree of understanding via:  Teach Back   Monitoring/Evaluation:  Dietary intake, exercise, and body weight prn.

## 2017-02-11 NOTE — Patient Instructions (Signed)
Continue getting in three meals per day. Try to eat balanced meals like the plate example including carbohydrates in moderation as shown on the balanced plate.   Try to practice mindfulness with eating-try to eat at a slower pace, at a table and without distractions.   Recommend keeping a food and symptom journal to help assess which foods worsen reflux. Continue limiting/avoiding foods that worsen reflux symptoms.    Try to get include regular physical activity-try for a goal of at least 30 minutes of activity most days of the week or 150 minutes per week.

## 2017-04-26 ENCOUNTER — Other Ambulatory Visit (HOSPITAL_BASED_OUTPATIENT_CLINIC_OR_DEPARTMENT_OTHER): Payer: Self-pay

## 2017-04-26 DIAGNOSIS — G4700 Insomnia, unspecified: Secondary | ICD-10-CM

## 2017-05-13 ENCOUNTER — Ambulatory Visit (HOSPITAL_BASED_OUTPATIENT_CLINIC_OR_DEPARTMENT_OTHER): Payer: Medicaid Other | Attending: Family Medicine | Admitting: Internal Medicine

## 2017-05-13 DIAGNOSIS — G4700 Insomnia, unspecified: Secondary | ICD-10-CM | POA: Insufficient documentation

## 2017-05-13 DIAGNOSIS — R0683 Snoring: Secondary | ICD-10-CM | POA: Diagnosis not present

## 2017-05-15 NOTE — Procedures (Signed)
    Patient Name: [name], [name] Study Date: 05/13/2017 Gender: Female D.O.B: March 27, 1986 Age (years): 31 Referring Provider: Renaye RakersVeita Bland Height (inches): 64 Interpreting Physician: Jetty Duhamellinton Jahki Witham MD, ABSM Weight (lbs): 250 RPSGT: Rolene ArbourMcConnico, Yvonne BMI: 43 MRN: [id number] Neck Size: 15.50 CLINICAL INFORMATION Sleep Study Type: NPSG  Indication for sleep study: Insomnia with OSA  Epworth Sleepiness Score: 13  SLEEP STUDY TECHNIQUE As per the AASM Manual for the Scoring of Sleep and Associated Events v2.3 (April 2016) with a hypopnea requiring 4% desaturations.  The channels recorded and monitored were frontal, central and occipital EEG, electrooculogram (EOG), submentalis EMG (chin), nasal and oral airflow, thoracic and abdominal wall motion, anterior tibialis EMG, snore microphone, electrocardiogram, and pulse oximetry.  MEDICATIONS Medications self-administered by patient taken the night of the study : none reported  SLEEP ARCHITECTURE The study was initiated at 10:50:57 PM and ended at 4:57:50 AM.  Sleep onset time was 106.3 minutes and the sleep efficiency was 32.2%. The total sleep time was 118.0 minutes.  Stage REM latency was 89.5 minutes.  The patient spent 2.54% of the night in stage N1 sleep, 79.24% in stage N2 sleep, 0.00% in stage N3 and 18.22% in REM.  Alpha intrusion was absent.  Supine sleep was 0.00%.  RESPIRATORY PARAMETERS The overall apnea/hypopnea index (AHI) was 2.5 per hour. There were 0 total apneas, including 0 obstructive, 0 central and 0 mixed apneas. There were 5 hypopneas and 5 RERAs.  The AHI during Stage REM sleep was 5.6 per hour.  AHI while supine was N/A per hour.  The mean oxygen saturation was 96.35%. The minimum SpO2 during sleep was 92.00%.  moderate snoring was noted during this study.  CARDIAC DATA The 2 lead EKG demonstrated sinus rhythm. The mean heart rate was 73.36 beats per minute. Other EKG findings include: None.  LEG  MOVEMENT DATA The total PLMS were 0 with a resulting PLMS index of 0.00. Associated arousal with leg movement index was 0.0 .  IMPRESSIONS - No significant obstructive sleep apnea occurred during this study (AHI = 2.5/h). - No significant central sleep apnea occurred during this study (CAI = 0.0/h). - Negligible oxygen desaturation was noted during this study (Min O2 = 92.00%). - The patient snored with moderate snoring volume. - No cardiac abnormalities were noted during this study. - Clinically significant periodic limb movements did not occur during sleep. No significant associated arousals. - Patient had difficulty initiating and maintaining sleep.  DIAGNOSIS - Primary Snoring (786.09 [R06.83 ICD-10])  RECOMMENDATIONS  - Sleep hygiene should be reviewed to assess factors that may improve sleep quality. - Weight management and regular exercise should be initiated or continued if appropriate.  [Electronically signed] 05/15/2017 10:44 AM  Jetty Duhamellinton Pecolia Marando MD, ABSM Diplomate, American Board of Sleep Medicine   NPI: [id number]                         Jetty Duhamellinton Emmerich Cryer Diplomate, American Board of Sleep Medicine  ELECTRONICALLY SIGNED ON:  05/15/2017, 10:39 AM [facility] PH: [phone]   FX: [phone] ACCREDITED BY THE AMERICAN ACADEMY OF SLEEP MEDICINE

## 2017-05-17 ENCOUNTER — Other Ambulatory Visit (HOSPITAL_BASED_OUTPATIENT_CLINIC_OR_DEPARTMENT_OTHER): Payer: Self-pay

## 2017-05-17 DIAGNOSIS — G4700 Insomnia, unspecified: Secondary | ICD-10-CM

## 2018-01-11 ENCOUNTER — Ambulatory Visit
Admission: RE | Admit: 2018-01-11 | Discharge: 2018-01-11 | Disposition: A | Discharge status: Home / Self Care | Payer: Medicaid Other | Source: Ambulatory Visit | Attending: Family Medicine | Admitting: Family Medicine

## 2018-01-11 ENCOUNTER — Other Ambulatory Visit: Payer: Self-pay | Admitting: Family Medicine

## 2018-01-11 DIAGNOSIS — M25531 Pain in right wrist: Secondary | ICD-10-CM

## 2018-01-11 DIAGNOSIS — M25532 Pain in left wrist: Secondary | ICD-10-CM

## 2018-01-11 DIAGNOSIS — M25551 Pain in right hip: Secondary | ICD-10-CM

## 2018-01-12 ENCOUNTER — Other Ambulatory Visit (HOSPITAL_COMMUNITY): Payer: Self-pay | Admitting: Family Medicine

## 2018-01-12 DIAGNOSIS — R1310 Dysphagia, unspecified: Secondary | ICD-10-CM

## 2018-01-16 ENCOUNTER — Ambulatory Visit (HOSPITAL_COMMUNITY): Payer: Medicaid Other

## 2018-01-20 ENCOUNTER — Ambulatory Visit (HOSPITAL_COMMUNITY)
Admission: RE | Admit: 2018-01-20 | Discharge: 2018-01-20 | Disposition: A | Discharge status: Home / Self Care | Payer: Medicaid Other | Source: Ambulatory Visit | Attending: Family Medicine | Admitting: Family Medicine

## 2018-01-20 DIAGNOSIS — R1312 Dysphagia, oropharyngeal phase: Secondary | ICD-10-CM | POA: Insufficient documentation

## 2018-01-20 DIAGNOSIS — R1310 Dysphagia, unspecified: Secondary | ICD-10-CM

## 2018-04-26 ENCOUNTER — Ambulatory Visit: Payer: Self-pay | Admitting: Rheumatology

## 2018-05-03 NOTE — Progress Notes (Signed)
Office Visit Note  Patient: [name]             Date of Birth: 1986-05-19           MRN: [id number]             PCP: Lucianne Lei, MD Referring: Iran Planas, MD Visit Date: 05/15/2018 Occupation: Homemaker  Subjective:  Pain in both wrist joints.   History of Present Illness: [name] is a 32 y.o. female seen in consultation per request of Dr. Caralyn Guile.  Patient states that her symptoms are started about 1 year ago with off-and-on pain in her bilateral wrist joints.  She states the pain is usually worse with activities and then  is better after resting.  She is also noticed visible swelling in her left wrist joint.  She denies any paresthesias in her hands.  None of the other joints are painful.  She states she does have some pain in the trapezius area and her lower back.  Patient states that her sister was diagnosed with an active tissue disease and was on infusion therapy.  Activities of Daily Living:  Patient reports morning stiffness for 0 minute.   Patient Denies nocturnal pain.  Difficulty dressing/grooming: Denies Difficulty climbing stairs: Denies Difficulty getting out of chair: Denies Difficulty using hands for taps, buttons, cutlery, and/or writing: Denies  Review of Systems  Constitutional: Negative for fatigue, night sweats, weight gain and weight loss.  HENT: Positive for mouth dryness. Negative for mouth sores, trouble swallowing, trouble swallowing and nose dryness.   Eyes: Negative for pain, redness, visual disturbance and dryness.  Respiratory: Negative for cough, shortness of breath and difficulty breathing.   Cardiovascular: Negative for chest pain, palpitations, hypertension, irregular heartbeat and swelling in legs/feet.  Gastrointestinal: Negative for blood in stool, constipation and diarrhea.  Endocrine: Negative for increased urination.  Genitourinary: Negative for vaginal dryness.  Musculoskeletal: Positive for arthralgias, joint pain and  joint swelling. Negative for myalgias, muscle weakness, morning stiffness, muscle tenderness and myalgias.  Skin: Negative for color change, rash, hair loss, skin tightness, ulcers and sensitivity to sunlight.  Allergic/Immunologic: Negative for susceptible to infections.  Neurological: Negative for dizziness, memory loss, night sweats and weakness.  Hematological: Negative for swollen glands.  Psychiatric/Behavioral: Positive for depressed mood. Negative for sleep disturbance. The patient is nervous/anxious.     PMFS History:  Patient Active Problem List   Diagnosis Date Noted  . GBS bacteriuria 06/23/2015  . History of depression 06/23/2015  . History of anxiety 06/23/2015  . History of PCOS 06/23/2015  . Morbid obesity ([place]) 06/23/2015  . Postpartum tubal ligation planned 06/23/2015  . Genital herpes 06/23/2015  . History of prior pregnancy with SGA newborn x2 06/23/2015  . History of gestational hypertension - first delivery - no meds 06/23/2015  . H/O migraine 06/23/2015  . Vaginal delivery 06/23/2015    Past Medical History:  Diagnosis Date  . Anxiety    no meds  . Depression    no meds  . PCOS (polycystic ovarian syndrome)     Family History  Problem Relation Age of Onset  . Hypertension Maternal Grandmother   . Cancer Maternal Grandmother   . Diabetes Maternal Grandfather   . Cancer Maternal Grandfather   . Diabetes Paternal Grandmother   . Hypertension Paternal Grandfather   . Sleep apnea Mother   . Hypertension Mother   . Sleep apnea Father   . Hypertension Father   . Healthy Daughter   .  Healthy Son   . Healthy Son    Past Surgical History:  Procedure Laterality Date  . TUBAL LIGATION Bilateral 06/24/2015   Procedure: POST PARTUM TUBAL LIGATION;  Surgeon: Ena Dawley, MD;  Location: [facility];  Service: Gynecology;  Laterality: Bilateral;  . WISDOM TOOTH EXTRACTION     Social History   Social History Narrative  . Not on file    Objective: Vital  Signs: BP (!) 130/91 (BP Location: Right Arm, Patient Position: Sitting, Cuff Size: Normal)   Pulse 66   Resp 14   Ht 5' 4.75" (1.645 m)   Wt 219 lb (99.3 kg)   BMI 36.73 kg/m    Physical Exam  Constitutional: She is oriented to person, place, and time. She appears well-developed and well-nourished.  HENT:  Head: Normocephalic and atraumatic.  Eyes: Conjunctivae and EOM are normal.  Neck: Normal range of motion.  Cardiovascular: Normal rate, regular rhythm, normal heart sounds and intact distal pulses.  Pulmonary/Chest: Effort normal and breath sounds normal.  Abdominal: Soft. Bowel sounds are normal.  Lymphadenopathy:    She has no cervical adenopathy.  Neurological: She is alert and oriented to person, place, and time.  Skin: Skin is warm and dry. Capillary refill takes less than 2 seconds.  Psychiatric: She has a normal mood and affect. Her behavior is normal.  Nursing note and vitals reviewed.    Musculoskeletal Exam: C-spine thoracic lumbar spine good range of motion.  Shoulder joints elbow joints were in good range of motion.  She has some hypermobility in her wrist joints with popping.  She also has some hypermobility in her MCPs and PIPs.  She has very flexible hip joints.  Knee joints are in good range of motion.  She has hypermobility in her ankle joints.  She has no difficulty getting up from the squatting position.  No warmth swelling or effusion was noted.  CDAI Exam: CDAI Score: Not documented Patient Global Assessment: Not documented; Provider Global Assessment: Not documented Swollen: Not documented; Tender: Not documented Joint Exam   Not documented   There is currently no information documented on the homunculus. Go to the Rheumatology activity and complete the homunculus joint exam.  Investigation: No additional findings.  Imaging: No results found.  Recent Labs: Lab Results  Component Value Date   WBC 9.7 02/16/2016   HGB 12.8 02/16/2016   PLT 319  02/16/2016   NA 137 02/16/2016   K 4.0 02/16/2016   CL 104 02/16/2016   CO2 26 02/16/2016   GLUCOSE 87 02/16/2016   BUN 12 02/16/2016   CREATININE 0.77 02/16/2016   BILITOT 0.9 06/23/2015   ALKPHOS 188 (H) 06/23/2015   AST 26 06/23/2015   ALT 34 06/23/2015   PROT 6.5 06/23/2015   ALBUMIN 3.2 (L) 06/23/2015   CALCIUM 9.3 02/16/2016   GFRAA >60 02/16/2016    Speciality Comments: No specialty comments available.  Procedures:  No procedures performed Allergies: Patient has no known allergies.   Assessment / Plan:     Visit Diagnoses: Bilateral wrist pain -patient complains of pain and discomfort in her bilateral wrist.  She also gives history of intermittent swelling in her left wrist joint.  I reviewed x-rays done by [facility] imaging of her bilateral wrist joints which were unremarkable.  I do not see any synovitis on examination today.  She appears to have some popping in her wrist and also hypermobility which could be contributing to discomfort.  Isometric exercises and joint protection was discussed.  Patient gives history of autoimmune disease in her sister.  I will obtain following labs today.- Plan: Sedimentation rate, Rheumatoid factor, Angiotensin converting enzyme, ANA, Cyclic citrul peptide antibody, IgG, 14-3-3 eta Protein, Uric acid, HLA-B27 antigen.  I will also schedule ultrasound of her bilateral wrist joints to evaluate further.  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  Other fatigue - Plan: CBC with Differential/Platelet, COMPLETE METABOLIC PANEL WITH GFR, CK, TSH  Anxiety and depression-patient is currently not taking any medications.  History of PCOS  Hx of migraines -controlled with over-the-counter NSAIDs.  Orders: Orders Placed This Encounter  Procedures  . CBC with Differential/Platelet  . COMPLETE METABOLIC PANEL WITH GFR  . CK  . TSH  . Sedimentation rate  . Rheumatoid factor  .  Angiotensin converting enzyme  . ANA  . Cyclic citrul peptide antibody, IgG  . 14-3-3 eta Protein  . Uric acid  . HLA-B27 antigen   No orders of the defined types were placed in this encounter.     Follow-Up Instructions: Return for Pain in both wrists.   Bo Merino, MD  Note - This record has been created using Editor, commissioning.  Chart creation errors have been sought, but may not always  have been located. Such creation errors do not reflect on  the standard of medical care.

## 2018-05-15 ENCOUNTER — Encounter: Payer: Self-pay | Admitting: Rheumatology

## 2018-05-15 ENCOUNTER — Ambulatory Visit: Payer: Medicaid Other | Admitting: Rheumatology

## 2018-05-15 VITALS — BP 130/91 | HR 66 | Resp 14 | Ht 64.75 in | Wt 219.0 lb

## 2018-05-15 DIAGNOSIS — F419 Anxiety disorder, unspecified: Secondary | ICD-10-CM | POA: Diagnosis not present

## 2018-05-15 DIAGNOSIS — R5383 Other fatigue: Secondary | ICD-10-CM

## 2018-05-15 DIAGNOSIS — F329 Major depressive disorder, single episode, unspecified: Secondary | ICD-10-CM

## 2018-05-15 DIAGNOSIS — M25531 Pain in right wrist: Secondary | ICD-10-CM

## 2018-05-15 DIAGNOSIS — M25532 Pain in left wrist: Secondary | ICD-10-CM

## 2018-05-15 DIAGNOSIS — Z8742 Personal history of other diseases of the female genital tract: Secondary | ICD-10-CM | POA: Diagnosis not present

## 2018-05-15 DIAGNOSIS — Z8669 Personal history of other diseases of the nervous system and sense organs: Secondary | ICD-10-CM

## 2018-05-15 DIAGNOSIS — M2550 Pain in unspecified joint: Secondary | ICD-10-CM

## 2018-05-15 DIAGNOSIS — F32A Depression, unspecified: Secondary | ICD-10-CM

## 2018-05-18 LAB — COMPLETE METABOLIC PANEL WITH GFR
AG Ratio: 1.8 (calc) (ref 1.0–2.5)
ALKALINE PHOSPHATASE (APISO): 49 U/L (ref 33–115)
ALT: 9 U/L (ref 6–29)
AST: 12 U/L (ref 10–30)
Albumin: 4.4 g/dL (ref 3.6–5.1)
BILIRUBIN TOTAL: 0.6 mg/dL (ref 0.2–1.2)
BUN: 13 mg/dL (ref 7–25)
CHLORIDE: 103 mmol/L (ref 98–110)
CO2: 27 mmol/L (ref 20–32)
CREATININE: 0.69 mg/dL (ref 0.50–1.10)
Calcium: 9.7 mg/dL (ref 8.6–10.2)
GFR, Est African American: 134 mL/min/{1.73_m2} (ref >=60)
GFR, Est Non African American: 116 mL/min/{1.73_m2} (ref >=60)
GLUCOSE: 75 mg/dL (ref 65–99)
Globulin: 2.5 g/dL (calc) (ref 1.9–3.7)
Potassium: 4.5 mmol/L (ref 3.5–5.3)
SODIUM: 137 mmol/L (ref 135–146)
Total Protein: 6.9 g/dL (ref 6.1–8.1)

## 2018-05-18 LAB — TSH: TSH: 0.68 m[IU]/L

## 2018-05-18 LAB — SEDIMENTATION RATE: Sed Rate: 6 mm/h (ref 0–20)

## 2018-05-18 LAB — CBC WITH DIFFERENTIAL/PLATELET
Basophils Absolute: 23 cells/uL (ref 0–200)
Basophils Relative: 0.3 %
Eosinophils Absolute: 300 cells/uL (ref 15–500)
Eosinophils Relative: 4 %
HCT: 41.5 % (ref 35.0–45.0)
Hemoglobin: 13.8 g/dL (ref 11.7–15.5)
LYMPHS ABS: 1680 {cells}/uL (ref 850–3900)
MCH: 28.8 pg (ref 27.0–33.0)
MCHC: 33.3 g/dL (ref 32.0–36.0)
MCV: 86.6 fL (ref 80.0–100.0)
MPV: 9.7 fL (ref 7.5–12.5)
Monocytes Relative: 7.1 %
NEUTROS PCT: 66.2 %
Neutro Abs: 4965 cells/uL (ref 1500–7800)
PLATELETS: 376 10*3/uL (ref 140–400)
RBC: 4.79 10*6/uL (ref 3.80–5.10)
RDW: 12.6 % (ref 11.0–15.0)
TOTAL LYMPHOCYTE: 22.4 %
WBC mixed population: 533 cells/uL (ref 200–950)
WBC: 7.5 10*3/uL (ref 3.8–10.8)

## 2018-05-18 LAB — CYCLIC CITRUL PEPTIDE ANTIBODY, IGG: Cyclic Citrullin Peptide Ab: <16 UNITS

## 2018-05-18 LAB — CK: Total CK: 51 U/L (ref 29–143)

## 2018-05-18 LAB — 14-3-3 ETA PROTEIN: 14-3-3 eta Protein: <0.2 ng/mL (ref <0.2)

## 2018-05-18 LAB — ANA: Anti Nuclear Antibody(ANA): NEGATIVE

## 2018-05-18 LAB — RHEUMATOID FACTOR: Rheumatoid fact SerPl-aCnc: <14 IU/mL (ref <14)

## 2018-05-18 LAB — HLA-B27 ANTIGEN: HLA-B27 Antigen: NEGATIVE

## 2018-05-18 LAB — ANGIOTENSIN CONVERTING ENZYME: Angiotensin-Converting Enzyme: 44 U/L (ref 9–67)

## 2018-05-18 LAB — URIC ACID: Uric Acid, Serum: 5.7 mg/dL (ref 2.5–7.0)

## 2018-05-18 NOTE — Progress Notes (Signed)
All labs are within normal limits.  I will discuss these at follow-up visit.

## 2018-05-23 ENCOUNTER — Ambulatory Visit: Payer: Medicaid Other | Admitting: Rheumatology

## 2018-05-23 ENCOUNTER — Ambulatory Visit (INDEPENDENT_AMBULATORY_CARE_PROVIDER_SITE_OTHER): Payer: Self-pay

## 2018-05-23 DIAGNOSIS — M79641 Pain in right hand: Secondary | ICD-10-CM | POA: Diagnosis not present

## 2018-05-23 DIAGNOSIS — M79642 Pain in left hand: Secondary | ICD-10-CM | POA: Diagnosis not present

## 2018-05-24 ENCOUNTER — Other Ambulatory Visit: Payer: Medicaid Other | Admitting: Rheumatology

## 2018-06-09 ENCOUNTER — Other Ambulatory Visit: Payer: Self-pay

## 2018-07-03 ENCOUNTER — Ambulatory Visit: Payer: Medicaid Other | Admitting: Rheumatology

## 2020-12-23 NOTE — Progress Notes (Signed)
Sent message, via epic in basket, to [email or web address] requesting orders in epic from surgeon.

## 2020-12-26 NOTE — Progress Notes (Signed)
Please enter orders for PAT visit scheduled for 12-30-20.

## 2020-12-26 NOTE — Patient Instructions (Addendum)
DUE TO COVID-19 ONLY ONE VISITOR IS ALLOWED TO COME WITH YOU AND STAY IN THE WAITING ROOM ONLY DURING PRE OP AND PROCEDURE.   **NO VISITORS ARE ALLOWED IN THE SHORT STAY AREA OR RECOVERY ROOM!!**         Your procedure is scheduled on: Wednesday, 01-07-21   Report to [facility] Main  Entrance   Report to admitting at 8:30 AM   Call this number if you have problems the morning of surgery [phone]   Do not eat food :After Midnight.   May have liquids until 7:30 AM day of surgery  CLEAR LIQUID DIET  Foods Allowed                                                                     Foods Excluded  Water, Black Coffee and tea, regular and decaf              liquids that you cannot  Plain Jell-O in any flavor  (No red)                                    see through such as: Fruit ices (not with fruit pulp)                                      milk, soups, orange juice              Iced Popsicles (No red)                                      All solid food                                   Apple juices Sports drinks like Gatorade (No red) Lightly seasoned clear broth or consume(fat free) Sugar, honey syrup      Oral Hygiene is also important to reduce your risk of infection.                                    Remember - BRUSH YOUR TEETH THE MORNING OF SURGERY WITH YOUR REGULAR TOOTHPASTE   Do NOT smoke after Midnight   Take these medicines the morning of surgery with A SIP OF WATER: None                               You may not have any metal on your body including hair pins, jewelry, and body piercing             Do not wear make-up, lotions, powders, perfumes or deodorant  Do not wear nail polish including gel and S&S, artificial/acrylic nails, or any other type of covering on natural nails including finger and toenails. If you have artificial nails, gel coating, etc. that needs to  be removed by a nail salon please have this removed prior to surgery or surgery may  need to be canceled/ delayed if the surgeon/ anesthesia feels like they are unable to be safely monitored.   Do not shave  48 hours prior to surgery.            Do not bring valuables to the hospital. [facility] IS NOT  RESPONSIBLE   FOR VALUABLES.   Contacts, dentures or bridgework may not be worn into surgery.    Patients discharged the day of surgery will not be allowed to drive home.   Please read over the following fact sheets you were given: IF YOU HAVE QUESTIONS ABOUT YOUR PRE OP INSTRUCTIONS PLEASE CALL [phone] [facility] - Preparing for Surgery Before surgery, you can play an important role.  Because skin is not sterile, your skin needs to be as free of germs as possible.  You can reduce the number of germs on your skin by washing with CHG (chlorahexidine gluconate) soap before surgery.  CHG is an antiseptic cleaner which kills germs and bonds with the skin to continue killing germs even after washing. Please DO NOT use if you have an allergy to CHG or antibacterial soaps.  If your skin becomes reddened/irritated stop using the CHG and inform your nurse when you arrive at Short Stay. Do not shave (including legs and underarms) for at least 48 hours prior to the first CHG shower.  You may shave your face/neck.  Please follow these instructions carefully:  1.  Shower with CHG Soap the night before surgery and the  morning of surgery.  2.  If you choose to wash your hair, wash your hair first as usual with your normal  shampoo.  3.  After you shampoo, rinse your hair and body thoroughly to remove the shampoo.                             4.  Use CHG as you would any other liquid soap.  You can apply chg directly to the skin and wash.  Gently with a scrungie or clean washcloth.  5.  Apply the CHG Soap to your body ONLY FROM THE NECK DOWN.   Do   not use on face/ open                           Wound or open sores. Avoid contact with eyes, ears mouth and   genitals (private  parts).                       Wash face,  Genitals (private parts) with your normal soap.             6.  Wash thoroughly, paying special attention to the area where your    surgery  will be performed.  7.  Thoroughly rinse your body with warm water from the neck down.  8.  DO NOT shower/wash with your normal soap after using and rinsing off the CHG Soap.                9.  Pat yourself dry with a clean towel.            10.  Wear clean pajamas.            11.  Place clean sheets on your bed  the night of your first shower and do not  sleep with pets. Day of Surgery : Do not apply any lotions/deodorants the morning of surgery.  Please wear clean clothes to the hospital/surgery center.  FAILURE TO FOLLOW THESE INSTRUCTIONS MAY RESULT IN THE CANCELLATION OF YOUR SURGERY  PATIENT SIGNATURE_________________________________  NURSE SIGNATURE__________________________________  ________________________________________________________________________

## 2020-12-30 ENCOUNTER — Encounter (HOSPITAL_COMMUNITY)
Admission: RE | Admit: 2020-12-30 | Discharge: 2020-12-30 | Disposition: A | Discharge status: Home / Self Care | Payer: Medicaid Other | Source: Ambulatory Visit | Attending: Specialist | Admitting: Specialist

## 2020-12-30 ENCOUNTER — Other Ambulatory Visit: Payer: Self-pay

## 2020-12-30 ENCOUNTER — Encounter (HOSPITAL_COMMUNITY): Payer: Self-pay

## 2020-12-30 DIAGNOSIS — Z01812 Encounter for preprocedural laboratory examination: Secondary | ICD-10-CM | POA: Insufficient documentation

## 2020-12-30 HISTORY — DX: Unspecified osteoarthritis, unspecified site: M19.90

## 2020-12-30 LAB — CBC
HCT: 40.3 % (ref 36.0–46.0)
Hemoglobin: 13.2 g/dL (ref 12.0–15.0)
MCH: 30.1 pg (ref 26.0–34.0)
MCHC: 32.8 g/dL (ref 30.0–36.0)
MCV: 92 fL (ref 80.0–100.0)
Platelets: 280 10*3/uL (ref 150–400)
RBC: 4.38 MIL/uL (ref 3.87–5.11)
RDW: 13 % (ref 11.5–15.5)
WBC: 5.5 10*3/uL (ref 4.0–10.5)
nRBC: 0 % (ref 0.0–0.2)

## 2020-12-30 NOTE — Progress Notes (Signed)
COVID Vaccine Completed: Yes  x2 Date COVID Vaccine completed: 09-13-19 Has received booster: 05-17-20 COVID vaccine manufacturer:  [employer]   Date of COVID positive in last 90 days: No  PCP - Renaye Rakers, MD Cardiologist - N/A  Chest x-ray - N/A EKG - N/A Stress Test - N/A ECHO - N/A Cardiac Cath -  Pacemaker/ICD device last checked: Spinal Cord Stimulator:  Sleep Study -  Yes,  neg sleep apnea CPAP - No  Fasting Blood Sugar - N/A Checks Blood Sugar _____ times a day  Blood Thinner Instructions: N/A Aspirin Instructions: Last Dose:  Activity level:  Can go up a flight of stairs and perform activities of daily living without stopping and without symptoms of chest pain or shortness of breath.  Able to exercise without symptoms of chest pain or SOB, patient works out daily.  Anesthesia review:  N/A  Patient denies shortness of breath, fever, cough and chest pain at PAT appointment   Patient verbalized understanding of instructions that were given to them at the PAT appointment. Patient was also instructed that they will need to review over the PAT instructions again at home before surgery.

## 2021-01-07 ENCOUNTER — Ambulatory Visit (HOSPITAL_COMMUNITY): Payer: Medicaid Other | Admitting: Anesthesiology

## 2021-01-07 ENCOUNTER — Encounter (HOSPITAL_COMMUNITY)
Admission: RE | Disposition: A | Discharge status: Home / Self Care | Payer: Self-pay | Source: Home / Self Care | Attending: Specialist

## 2021-01-07 ENCOUNTER — Encounter (HOSPITAL_COMMUNITY): Payer: Self-pay | Admitting: Specialist

## 2021-01-07 ENCOUNTER — Ambulatory Visit (HOSPITAL_COMMUNITY)
Admission: RE | Admit: 2021-01-07 | Discharge: 2021-01-07 | Disposition: A | Discharge status: Home / Self Care | Payer: Medicaid Other | Attending: Specialist | Admitting: Specialist

## 2021-01-07 DIAGNOSIS — X58XXXA Exposure to other specified factors, initial encounter: Secondary | ICD-10-CM | POA: Diagnosis not present

## 2021-01-07 DIAGNOSIS — Z87891 Personal history of nicotine dependence: Secondary | ICD-10-CM | POA: Diagnosis not present

## 2021-01-07 DIAGNOSIS — Y9344 Activity, trampolining: Secondary | ICD-10-CM | POA: Insufficient documentation

## 2021-01-07 DIAGNOSIS — S83511A Sprain of anterior cruciate ligament of right knee, initial encounter: Secondary | ICD-10-CM | POA: Diagnosis not present

## 2021-01-07 DIAGNOSIS — M94261 Chondromalacia, right knee: Secondary | ICD-10-CM | POA: Diagnosis not present

## 2021-01-07 DIAGNOSIS — S83231A Complex tear of medial meniscus, current injury, right knee, initial encounter: Secondary | ICD-10-CM | POA: Diagnosis not present

## 2021-01-07 HISTORY — PX: KNEE ARTHROSCOPY WITH ANTERIOR CRUCIATE LIGAMENT (ACL) REPAIR: SHX5644

## 2021-01-07 LAB — COMPREHENSIVE METABOLIC PANEL
ALT: 13 U/L (ref 0–44)
AST: 17 U/L (ref 15–41)
Albumin: 4.5 g/dL (ref 3.5–5.0)
Alkaline Phosphatase: 33 U/L — ABNORMAL LOW (ref 38–126)
Anion gap: 13 (ref 5–15)
BUN: 15 mg/dL (ref 6–20)
CO2: 20 mmol/L — ABNORMAL LOW (ref 22–32)
Calcium: 9.5 mg/dL (ref 8.9–10.3)
Chloride: 106 mmol/L (ref 98–111)
Creatinine, Ser: 0.52 mg/dL (ref 0.44–1.00)
GFR, Estimated: >60 mL/min (ref >60)
Glucose, Bld: 85 mg/dL (ref 70–99)
Potassium: 4.4 mmol/L (ref 3.5–5.1)
Sodium: 139 mmol/L (ref 135–145)
Total Bilirubin: 1.2 mg/dL (ref 0.3–1.2)
Total Protein: 7.4 g/dL (ref 6.5–8.1)

## 2021-01-07 LAB — CBC
HCT: 41.6 % (ref 36.0–46.0)
Hemoglobin: 13.6 g/dL (ref 12.0–15.0)
MCH: 29.9 pg (ref 26.0–34.0)
MCHC: 32.7 g/dL (ref 30.0–36.0)
MCV: 91.4 fL (ref 80.0–100.0)
Platelets: 301 10*3/uL (ref 150–400)
RBC: 4.55 MIL/uL (ref 3.87–5.11)
RDW: 13.1 % (ref 11.5–15.5)
WBC: 5.8 10*3/uL (ref 4.0–10.5)
nRBC: 0 % (ref 0.0–0.2)

## 2021-01-07 LAB — PREGNANCY, URINE: Preg Test, Ur: NEGATIVE

## 2021-01-07 SURGERY — KNEE ARTHROSCOPY WITH ANTERIOR CRUCIATE LIGAMENT (ACL) REPAIR
Anesthesia: Regional | Site: Knee | Laterality: Right

## 2021-01-07 MED ORDER — FENTANYL CITRATE (PF) 100 MCG/2ML IJ SOLN: 50.0000 ug | INTRAMUSCULAR | Status: DC

## 2021-01-07 MED ORDER — FENTANYL CITRATE (PF) 100 MCG/2ML IJ SOLN
INTRAMUSCULAR | Status: AC
  Administered 2021-01-07: 100 ug via INTRAVENOUS
  Filled 2021-01-07: qty 2

## 2021-01-07 MED ORDER — OXYCODONE HCL 5 MG PO TABS: 5.0000 mg | ORAL_TABLET | ORAL | 0 refills | Status: AC | PRN

## 2021-01-07 MED ORDER — ONDANSETRON HCL 4 MG PO TABS: 4.0000 mg | ORAL_TABLET | Freq: Every day | ORAL | 1 refills | Status: AC | PRN

## 2021-01-07 MED ORDER — ONDANSETRON HCL 4 MG/2ML IJ SOLN
INTRAMUSCULAR | Status: DC | PRN
  Administered 2021-01-07: 4 mg via INTRAVENOUS

## 2021-01-07 MED ORDER — ONDANSETRON HCL 4 MG/2ML IJ SOLN: 4.0000 mg | Freq: Once | INTRAMUSCULAR | Status: DC | PRN

## 2021-01-07 MED ORDER — FENTANYL CITRATE (PF) 250 MCG/5ML IJ SOLN
INTRAMUSCULAR | Status: AC
  Filled 2021-01-07: qty 5

## 2021-01-07 MED ORDER — LIDOCAINE 2% (20 MG/ML) 5 ML SYRINGE
INTRAMUSCULAR | Status: AC
  Filled 2021-01-07: qty 5

## 2021-01-07 MED ORDER — BUPIVACAINE HCL (PF) 0.5 % IJ SOLN
INTRAMUSCULAR | Status: DC | PRN
  Administered 2021-01-07: 25 mL via PERINEURAL

## 2021-01-07 MED ORDER — LIDOCAINE HCL (CARDIAC) PF 100 MG/5ML IV SOSY
PREFILLED_SYRINGE | INTRAVENOUS | Status: DC | PRN
  Administered 2021-01-07: 100 mg via INTRAVENOUS

## 2021-01-07 MED ORDER — MIDAZOLAM HCL 2 MG/2ML IJ SOLN: 1.0000 mg | INTRAMUSCULAR | Status: DC

## 2021-01-07 MED ORDER — DEXAMETHASONE SODIUM PHOSPHATE 10 MG/ML IJ SOLN
INTRAMUSCULAR | Status: AC
  Filled 2021-01-07: qty 1

## 2021-01-07 MED ORDER — FENTANYL CITRATE (PF) 100 MCG/2ML IJ SOLN
INTRAMUSCULAR | Status: DC | PRN
  Administered 2021-01-07 (×2): 100 ug via INTRAVENOUS
  Administered 2021-01-07 (×3): 50 ug via INTRAVENOUS

## 2021-01-07 MED ORDER — METHOCARBAMOL 500 MG PO TABS: 500.0000 mg | ORAL_TABLET | Freq: Four times a day (QID) | ORAL | 0 refills | Status: DC

## 2021-01-07 MED ORDER — LACTATED RINGERS IV SOLN: INTRAVENOUS | Status: DC

## 2021-01-07 MED ORDER — SODIUM CHLORIDE 0.9 % IR SOLN
Status: DC | PRN
  Administered 2021-01-07: 24000 mL

## 2021-01-07 MED ORDER — FENTANYL CITRATE (PF) 100 MCG/2ML IJ SOLN
INTRAMUSCULAR | Status: AC
  Filled 2021-01-07: qty 2

## 2021-01-07 MED ORDER — ORAL CARE MOUTH RINSE: 15.0000 mL | Freq: Once | OROMUCOSAL | Status: AC

## 2021-01-07 MED ORDER — OXYCODONE HCL 5 MG/5ML PO SOLN: 5.0000 mg | Freq: Once | ORAL | Status: DC | PRN

## 2021-01-07 MED ORDER — MEPERIDINE HCL 50 MG/ML IJ SOLN: 6.2500 mg | INTRAMUSCULAR | Status: DC | PRN

## 2021-01-07 MED ORDER — ONDANSETRON HCL 4 MG/2ML IJ SOLN
INTRAMUSCULAR | Status: AC
  Filled 2021-01-07: qty 2

## 2021-01-07 MED ORDER — MEPERIDINE HCL 50 MG/ML IJ SOLN
INTRAMUSCULAR | Status: AC
  Administered 2021-01-07: 12.5 mg via INTRAVENOUS
  Filled 2021-01-07: qty 1

## 2021-01-07 MED ORDER — BUPIVACAINE HCL (PF) 0.25 % IJ SOLN
INTRAMUSCULAR | Status: DC | PRN
  Administered 2021-01-07: 20 mL

## 2021-01-07 MED ORDER — MIDAZOLAM HCL 2 MG/2ML IJ SOLN
INTRAMUSCULAR | Status: AC
  Administered 2021-01-07: 2 mg via INTRAVENOUS
  Filled 2021-01-07: qty 2

## 2021-01-07 MED ORDER — DEXMEDETOMIDINE (PRECEDEX) IN NS 20 MCG/5ML (4 MCG/ML) IV SYRINGE
PREFILLED_SYRINGE | INTRAVENOUS | Status: DC | PRN
  Administered 2021-01-07: 20 ug via INTRAVENOUS

## 2021-01-07 MED ORDER — CHLORHEXIDINE GLUCONATE 0.12 % MT SOLN
15.0000 mL | Freq: Once | OROMUCOSAL | Status: AC
  Administered 2021-01-07: 15 mL via OROMUCOSAL

## 2021-01-07 MED ORDER — CEPHALEXIN 500 MG PO CAPS: 500.0000 mg | ORAL_CAPSULE | Freq: Four times a day (QID) | ORAL | 0 refills | Status: AC

## 2021-01-07 MED ORDER — TRAMADOL HCL 50 MG PO TABS: 50.0000 mg | ORAL_TABLET | Freq: Four times a day (QID) | ORAL | 0 refills | Status: AC | PRN

## 2021-01-07 MED ORDER — CEFAZOLIN SODIUM-DEXTROSE 2-4 GM/100ML-% IV SOLN
2.0000 g | INTRAVENOUS | Status: AC
  Administered 2021-01-07: 2 g via INTRAVENOUS
  Filled 2021-01-07: qty 100

## 2021-01-07 MED ORDER — BUPIVACAINE HCL (PF) 0.25 % IJ SOLN
INTRAMUSCULAR | Status: AC
  Filled 2021-01-07: qty 30

## 2021-01-07 MED ORDER — FENTANYL CITRATE (PF) 100 MCG/2ML IJ SOLN: 25.0000 ug | INTRAMUSCULAR | Status: DC | PRN

## 2021-01-07 MED ORDER — PROPOFOL 10 MG/ML IV BOLUS
INTRAVENOUS | Status: DC | PRN
  Administered 2021-01-07: 200 mg via INTRAVENOUS

## 2021-01-07 MED ORDER — ACETAMINOPHEN 325 MG PO TABS: 325.0000 mg | ORAL_TABLET | ORAL | Status: DC | PRN

## 2021-01-07 MED ORDER — DEXAMETHASONE SODIUM PHOSPHATE 10 MG/ML IJ SOLN
INTRAMUSCULAR | Status: DC | PRN
  Administered 2021-01-07: 10 mg

## 2021-01-07 MED ORDER — DEXMEDETOMIDINE (PRECEDEX) IN NS 20 MCG/5ML (4 MCG/ML) IV SYRINGE
PREFILLED_SYRINGE | INTRAVENOUS | Status: AC
  Filled 2021-01-07: qty 5

## 2021-01-07 MED ORDER — DEXAMETHASONE SODIUM PHOSPHATE 10 MG/ML IJ SOLN
INTRAMUSCULAR | Status: DC | PRN
  Administered 2021-01-07: 4 mg via INTRAVENOUS

## 2021-01-07 MED ORDER — OXYCODONE HCL 5 MG PO TABS
5.0000 mg | ORAL_TABLET | Freq: Once | ORAL | Status: DC | PRN
Start: 2021-01-07 — End: 2021-01-07

## 2021-01-07 MED ORDER — ACETAMINOPHEN 160 MG/5ML PO SOLN: 325.0000 mg | ORAL | Status: DC | PRN

## 2021-01-07 SURGICAL SUPPLY — 77 items
ANCH SUT 2-0 CVD FBRSTCH PLSTR (Anchor) ×1 IMPLANT
ANCH SUT SWLK 19.1X4.75 VT (Anchor) ×1 IMPLANT
ANCHOR PEEK 4.75X19.1 SWLK C (Anchor) ×2 IMPLANT
ANCHOR TIGHTROPE II 20 (Anchor) ×2 IMPLANT
ANCHOR TIGHTROPE II 20 W/IB (Anchor) ×2 IMPLANT
BANDAGE ESMARK 6X9 LF (GAUZE/BANDAGES/DRESSINGS) ×1 IMPLANT
BLADE EXCALIBUR 5.0X13 (MISCELLANEOUS) ×2 IMPLANT
BLADE SURG 15 STRL LF DISP TIS (BLADE) ×1 IMPLANT
BLADE SURG 15 STRL SS (BLADE) ×2
BLADE SURG SZ10 CARB STEEL (BLADE) ×2 IMPLANT
BNDG CMPR 9X6 STRL LF SNTH (GAUZE/BANDAGES/DRESSINGS) ×1
BNDG ESMARK 6X9 LF (GAUZE/BANDAGES/DRESSINGS) ×2
BNDG GAUZE ELAST 4 BULKY (GAUZE/BANDAGES/DRESSINGS) ×2 IMPLANT
BURR OVAL 8 FLU 4.0X13 (MISCELLANEOUS) ×2 IMPLANT
COVER BACK TABLE 60X90IN (DRAPES) ×2 IMPLANT
CUFF TOURN SGL QUICK 34 (TOURNIQUET CUFF) ×2
CUFF TRNQT CYL 34X4X40X1 (TOURNIQUET CUFF) ×1 IMPLANT
CUTTER TENSIONER SUT 2-0 0 FBW (INSTRUMENTS) ×2 IMPLANT
DRAPE ARTHROSCOPY W/POUCH 114 (DRAPES) ×2 IMPLANT
DRAPE INCISE IOBAN 66X45 STRL (DRAPES) ×2 IMPLANT
DRAPE SHEET LG 3/4 BI-LAMINATE (DRAPES) ×6 IMPLANT
DRAPE U-SHAPE 47X51 STRL (DRAPES) ×2 IMPLANT
DRILL FLIPCUTTER II 8.0MM (INSTRUMENTS) ×1 IMPLANT
DRSG PAD ABDOMINAL 8X10 ST (GAUZE/BANDAGES/DRESSINGS) ×4 IMPLANT
DURAPREP 26ML APPLICATOR (WOUND CARE) ×2 IMPLANT
DW OUTFLOW CASSETTE/TUBE SET (MISCELLANEOUS) ×2 IMPLANT
ELECT REM PT RETURN 15FT ADLT (MISCELLANEOUS) ×2 IMPLANT
EXCALIBUR 3.8MM X 13CM (MISCELLANEOUS) ×2 IMPLANT
FIBERSTICK 2 (SUTURE) ×2 IMPLANT
FLIPCUTTER II 8.0MM (INSTRUMENTS) ×2
GAUZE 4X4 16PLY ~~LOC~~+RFID DBL (SPONGE) ×2 IMPLANT
GAUZE SPONGE 4X4 12PLY STRL (GAUZE/BANDAGES/DRESSINGS) ×4 IMPLANT
GAUZE XEROFORM 1X8 LF (GAUZE/BANDAGES/DRESSINGS) ×2 IMPLANT
GLOVE SRG 8 PF TXTR STRL LF DI (GLOVE) ×2 IMPLANT
GLOVE SURG ENC MOIS LTX SZ7.5 (GLOVE) ×2 IMPLANT
GLOVE SURG ENC MOIS LTX SZ8 (GLOVE) ×4 IMPLANT
GLOVE SURG UNDER POLY LF SZ8 (GLOVE) ×4
GOWN STRL REUS W/TWL LRG LVL3 (GOWN DISPOSABLE) ×6 IMPLANT
IMMOBILIZER KNEE 20 (SOFTGOODS) ×2
IMMOBILIZER KNEE 20 THIGH 36 (SOFTGOODS) ×1 IMPLANT
IMPL FIBERSTICH 2-0 CVD (Anchor) ×1 IMPLANT
IMPLANT FIBERSTICH 2-0 CVD (Anchor) ×2 IMPLANT
IV NS IRRIG 3000ML ARTHROMATIC (IV SOLUTION) ×8 IMPLANT
KIT BASIN OR (CUSTOM PROCEDURE TRAY) ×2 IMPLANT
KIT BUTTON TIGHTROPE ABS 8X12 (Anchor) ×2 IMPLANT
KIT TURNOVER CYSTO (KITS) ×2 IMPLANT
MANIFOLD NEPTUNE II (INSTRUMENTS) ×2 IMPLANT
NEEDLE HYPO 22GX1.5 SAFETY (NEEDLE) IMPLANT
PACK ARTHROSCOPY DSU (CUSTOM PROCEDURE TRAY) ×2 IMPLANT
PAD ARMBOARD 7.5X6 YLW CONV (MISCELLANEOUS) IMPLANT
PENCIL SMOKE EVACUATOR (MISCELLANEOUS) ×2 IMPLANT
PORT APPOLLO RF 90DEGREE MULTI (SURGICAL WAND) ×2 IMPLANT
PORTAL SKID DEVICE (INSTRUMENTS) ×2 IMPLANT
SET PAD KNEE POSITIONER (MISCELLANEOUS) ×2 IMPLANT
SPONGE T-LAP 4X18 ~~LOC~~+RFID (SPONGE) ×2 IMPLANT
STRIP CLOSURE SKIN 1/2X4 (GAUZE/BANDAGES/DRESSINGS) ×2 IMPLANT
SUCTION FRAZIER HANDLE 10FR (MISCELLANEOUS) ×2
SUCTION TUBE FRAZIER 10FR DISP (MISCELLANEOUS) ×1 IMPLANT
SUT 2 FIBERLOOP 20 STRT BLUE (SUTURE) ×2
SUT ETHILON 4 0 PS 2 18 (SUTURE) ×2 IMPLANT
SUT FIBERWIRE #2 38 T-5 BLUE (SUTURE) ×2
SUT MNCRL AB 3-0 PS2 18 (SUTURE) ×2 IMPLANT
SUT VIC AB 0 CT1 36 (SUTURE) IMPLANT
SUT VIC AB 0 CT2 27 (SUTURE) ×4 IMPLANT
SUT VIC AB 2-0 CT1 27 (SUTURE) ×2
SUT VIC AB 2-0 CT1 TAPERPNT 27 (SUTURE) ×1 IMPLANT
SUTURE 2 FIBERLOOP 20 STRT BLU (SUTURE) ×1 IMPLANT
SUTURE FIBERWR #2 38 T-5 BLUE (SUTURE) ×1 IMPLANT
SUTURE TAPE TIGERLINK 1.3MM BL (SUTURE) ×1 IMPLANT
SUTURE TIGERSTICK 2 TIGERWIR 2 (MISCELLANEOUS) ×1 IMPLANT
SUTURETAPE TIGERLINK 1.3MM BL (SUTURE) ×2
SYR CONTROL 10ML LL (SYRINGE) ×2 IMPLANT
TIGERSTICK 2 TIGERWIRE 2 (MISCELLANEOUS) ×2
TOWEL OR 17X26 10 PK STRL BLUE (TOWEL DISPOSABLE) ×4 IMPLANT
TUBING ARTHROSCOPY IRRIG 16FT (MISCELLANEOUS) ×2 IMPLANT
TUBING CONNECTING 10 (TUBING) ×2 IMPLANT
WATER STERILE IRR 500ML POUR (IV SOLUTION) ×2 IMPLANT

## 2021-01-07 NOTE — Progress Notes (Signed)
AssistedDr. Oddono with right, ultrasound guided, adductor canal block. Side rails up, monitors on throughout procedure. See vital signs in flow sheet. Tolerated Procedure well.  

## 2021-01-07 NOTE — Interval H&P Note (Signed)
History and Physical Interval Note:  01/07/2021 11:53 AM  [name]  has presented today for surgery, with the diagnosis of Right knee torn ACL.  The various methods of treatment have been discussed with the patient and family. After consideration of risks, benefits and other options for treatment, the patient has consented to  Procedure(s) with comments: KNEE ARTHROSCOPY WITH ANTERIOR CRUCIATE LIGAMENT (ACL) REPAIR partial medial and lateral meniscectomy vs repair chondroplasty (Right) - with adductor cancal and knee block as a surgical intervention.  The patient's history has been reviewed, patient examined, no change in status, stable for surgery.  I have reviewed the patient's chart and labs.  Questions were answered to the patient's satisfaction.     [name]

## 2021-01-07 NOTE — Transfer of Care (Signed)
Immediate Anesthesia Transfer of Care Note  Patient: [name]  Procedure(s) Performed: KNEE ARTHROSCOPY WITH ANTERIOR CRUCIATE LIGAMENT (ACL) REPAIR partial medial and lateral meniscectomy, repair chondroplasty (Right: Knee)  Patient Location: PACU  Anesthesia Type:GA combined with regional for post-op pain  Level of Consciousness: awake, alert , oriented and patient cooperative  Airway & Oxygen Therapy: Patient Spontanous Breathing and Patient connected to face mask oxygen  Post-op Assessment: Report given to RN and Post -op Vital signs reviewed and stable  Post vital signs: Reviewed and stable--pt shivering, warm blankets applied  Last Vitals:  Vitals Value Taken Time  BP 152/104 01/07/21 1423  Temp    Pulse 76 01/07/21 1425  Resp 21 01/07/21 1425  SpO2 100 % 01/07/21 1425  Vitals shown include unvalidated device data.  Last Pain:  Vitals:   01/07/21 0934  TempSrc:   PainSc: 0-No pain      Patients Stated Pain Goal: 3 (01/07/21 0934)  Complications: No notable events documented.

## 2021-01-07 NOTE — Anesthesia Procedure Notes (Signed)
Procedure Name: LMA Insertion Date/Time: 01/07/2021 12:00 PM Performed by: Yolonda Kida, CRNA Pre-anesthesia Checklist: Patient identified, Emergency Drugs available, Patient being monitored and Suction available Patient Re-evaluated:Patient Re-evaluated prior to induction Oxygen Delivery Method: Circle system utilized Preoxygenation: Pre-oxygenation with 100% oxygen Induction Type: IV induction Ventilation: Mask ventilation without difficulty LMA: LMA inserted LMA Size: 4.0 Number of attempts: 1 Placement Confirmation: positive ETCO2 and breath sounds checked- equal and bilateral Tube secured with: Tape Dental Injury: Teeth and Oropharynx as per pre-operative assessment

## 2021-01-07 NOTE — Anesthesia Preprocedure Evaluation (Addendum)
Anesthesia Evaluation  Patient identified by MRN, date of birth, ID band Patient awake    Reviewed: Allergy & Precautions, H&P , NPO status , Patient's Chart, lab work & pertinent test results, reviewed documented beta blocker date and time   History of Anesthesia Complications Negative for: history of anesthetic complications  Airway Mallampati: I  TM Distance: >3 FB Neck ROM: full    Dental  (+) Teeth Intact, Dental Advisory Given   Pulmonary neg pulmonary ROS, former smoker,    breath sounds clear to auscultation       Cardiovascular negative cardio ROS   Rhythm:regular Rate:Normal     Neuro/Psych PSYCHIATRIC DISORDERS Anxiety Depression negative neurological ROS     GI/Hepatic negative GI ROS, Neg liver ROS,   Endo/Other  Morbid obesity  Renal/GU negative Renal ROS     Musculoskeletal  (+) Arthritis , Osteoarthritis,    Abdominal   Peds  Hematology negative hematology ROS (+)   Anesthesia Other Findings   Reproductive/Obstetrics                            Anesthesia Physical  Anesthesia Plan  ASA: 3  Anesthesia Plan: General and Regional   Post-op Pain Management: GA combined w/ Regional for post-op pain   Induction: Intravenous  PONV Risk Score and Plan: 3 and Dexamethasone and Treatment may vary due to age or medical condition  Airway Management Planned: Oral ETT and LMA  Additional Equipment: None  Intra-op Plan:   Post-operative Plan: Extubation in OR  Informed Consent: I have reviewed the patients History and Physical, chart, labs and discussed the procedure including the risks, benefits and alternatives for the proposed anesthesia with the patient or authorized representative who has indicated his/her understanding and acceptance.     Dental advisory given  Plan Discussed with: Anesthesiologist and CRNA  Anesthesia Plan Comments: (Discussed both nerve block  for pain relief post-op and GA; including NV, sore throat, dental injury, and pulmonary complications)       Anesthesia Quick Evaluation

## 2021-01-07 NOTE — Op Note (Signed)
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 Procedure #1 right knee arthroscopic assisted autograft anterior cruciate ligament reconstruction #2 right knee arthroscopic partial medial meniscectomy #3 arthroscopic chondroplasty medial femoral condyle Surgeon Valma Cava, MD Assistant Harl Favor, PA-C Anesthesia abductor canal block General with intraoperative knee block Estimated blood loss minimal Drains none Complications none Tourniquet time 80 minutes 325 mmHg Disposition PACU stable  Operative details Patient was encountered in the holding x-rays identified marked signed appropriate IV started sedation given and block was administered.  IV antibiotics were given within 1 hour of the surgical incision time.  She was taken operating room placed in position under general anesthesia the right lower extremity was elevated prepped with DuraPrep and draped in a sterile fashion timeout done confirm the right side exsanguinated with an Esmarch tourniquet flighted to 325 mmHg.  Attention was first directed to the graft harvesting and incision was made over the pes tendons to the skin subcutaneous tissue small veins electrocoagulated sartorius fascia was opened the gracilis was identified separated from semitendinosus the semitendinosus was released from its insertion site and harvested in standard fashion.  Saphenous vein and neurovascular structures and nerve were protected and sent to.  The graft was taken to the back table and prepared by Ms. Leanne Hoss, PA-C for 4 bundle all inside ACL reconstruction.  I closed the sartorius fascia running Vicryl suture.  Arthroscopic portal established inferomedial inferolateral diagnostic  arthroscopy revealed complete rupture anterior cruciate ligament ACL fibers debrided PCL was protected notchplasty was performed in standard fashion the over-the-top position was confirmed.  Patellofemoral joint were normal to cartilage and tracking suprapatellar pouch medial gutters unremarkable lateral side was inspected to culture was healthy as was lateral meniscus the medial side was inspected there was grade 2 combination femoral condyle unstable chondral flaps and chondroplasty which shaver the back to stable base there is also a white white tear the posterior horn medial meniscus initially contemplated repairing this however.  For any residual parable he was admitted motorized shaver today found to be complex 3 and a basket back to a nice smooth edge.  The femoral guide was put into the over-the-top position small stab was made laterally and Arthrex flip cutter was put into the anatomic ACL footprint a retrosocket was performed FiberWire suture passed in place and debris was removed.  Patella was then smoothed down nicely.  On the tibial side guide.  Next and topical ACL footprint and retrosocket performed FiberWire suture passed in place and debris was removed the graft then delivered to the knee of 5 securely fix the the femoral button on the lateral femoral cortex performed pulse palpable visual and arthroscopically.  The graft then delivered to the femoral socket in a retrograde fashion.  30 degrees of flexion the graft then placed into the tibial socket and tensioned down nicely.  The graft had excellent orientation tension.  I securely fixed the tibial side with a button.  After confirming graft excellent rotation and positioning and tensioning and movement pattern he was then placed in full extension for swivel lock internal brace.  Knee was put through range of motion flexion provide fascia graft orientation tension and stability of the knee.  Excess sutures were removed.  There is already very  close running Vicryl  the subcu Vicryl skin with a nylons laterally portal nylon anteriorly subcu Vicryl skin was subcuticular Vicryl suture Steri-Strips was applied another 30 cc of 0.25% Sensorcaine was placed in the knee again case as confirmed anesthesiologist.  Sterile dressing was applied tourniquet deflated normal circulation foot neck in the case.  She is placed in knee immobilizer for extension she will be taken to the operating room PACU in stable condition.  Sponge needle count correct no complications or problems.  She was stabilized PACU discharged home she will be seen back in office on Friday or Monday for start therapy.  Help with patient positioning prepping draping technical surgical assistant entire case wound closure graft preparation and surgical management Ms. Leanne Hoss, PA-C assistance was needed.

## 2021-01-07 NOTE — Anesthesia Procedure Notes (Signed)
Anesthesia Regional Block: Adductor canal block   Pre-Anesthetic Checklist: , timeout performed,  Correct Patient, Correct Site, Correct Laterality,  Correct Procedure, Correct Position, site marked,  Risks and benefits discussed,  Surgical consent,  Pre-op evaluation,  At surgeon's request and post-op pain management  Laterality: Right  Prep: chloraprep       Needles:  Injection technique: Single-shot  Needle Type: Echogenic Stimulator Needle     Needle Length: 5cm  Needle Gauge: 22     Additional Needles:   Procedures:, nerve stimulator,,, ultrasound used (permanent image in chart),,    Narrative:  Start time: 01/07/2021 10:21 AM End time: 01/07/2021 10:25 AM Injection made incrementally with aspirations every 5 mL.  Performed by: Personally  Anesthesiologist: Bethena Midget, MD  Additional Notes: Functioning IV was confirmed and monitors were applied.  A 75mm 22ga Arrow echogenic stimulator needle was used. Sterile prep and drape,hand hygiene and sterile gloves were used. Ultrasound guidance: relevant anatomy identified, needle position confirmed, local anesthetic spread visualized around nerve(s)., vascular puncture avoided.  Image printed for medical record. Negative aspiration and negative test dose prior to incremental administration of local anesthetic. The patient tolerated the procedure well.

## 2021-01-07 NOTE — H&P (View-Only) (Signed)
AssistedDr. Oddono with right, ultrasound guided, adductor canal block. Side rails up, monitors on throughout procedure. See vital signs in flow sheet. Tolerated Procedure well.  

## 2021-01-07 NOTE — Progress Notes (Signed)
Pt brought brace with her to the hospital.  Spoke with L. Haus, PA and states patient should wear knee immobilizer until next office visit.  Pt should bring brace with her to the office visit in 3-4 days

## 2021-01-07 NOTE — H&P (Signed)
[name] is an 35 y.o. female.   Chief Complaint: right knee pain and instability HPI: Patient had an injury to this right knee back in 2020, she was jumping on the trampoline with her kids and felt a pop. She has been treating conservatively since then. She is now having a lot of difficulty with stability. MRI revealed a ACL tear and medial and lateral meniscus tear. She has elected to proceed with surgery at this time.   Past Medical History:  Diagnosis Date   Anxiety    no meds   Arthritis    Depression    no meds   PCOS (polycystic ovarian syndrome)     Past Surgical History:  Procedure Laterality Date   BREAST REDUCTION SURGERY     TUBAL LIGATION Bilateral 06/24/2015   Procedure: POST PARTUM TUBAL LIGATION;  Surgeon: Kirkland Hun, MD;  Location: [facility];  Service: Gynecology;  Laterality: Bilateral;   WISDOM TOOTH EXTRACTION      Family History  Problem Relation Age of Onset   Hypertension Maternal Grandmother    Cancer Maternal Grandmother    Diabetes Maternal Grandfather    Cancer Maternal Grandfather    Diabetes Paternal Grandmother    Hypertension Paternal Grandfather    Sleep apnea Mother    Hypertension Mother    Sleep apnea Father    Hypertension Father    Healthy Daughter    Healthy Son    Healthy Son    Social History:  reports that she quit smoking about 9 years ago. Her smoking use included cigarettes. She has never used smokeless tobacco. She reports current alcohol use. She reports that she does not use drugs.  Allergies: No Known Allergies  Medications Prior to Admission  Medication Sig Dispense Refill   Ascorbic Acid (VITAMIN C) 1000 MG tablet Take 1,000 mg by mouth daily.     Biotin 1000 MCG tablet Take 1,000 mcg by mouth daily.     cholecalciferol (VITAMIN D3) 25 MCG (1000 UNIT) tablet Take 1,000 Units by mouth daily.     ibuprofen (ADVIL) 200 MG tablet Take 400 mg by mouth every 8 (eight) hours as needed for moderate pain.     magnesium  oxide (MAG-OX) 400 MG tablet Take 400 mg by mouth daily.     Multiple Vitamins-Minerals (MULTIVITAMIN WITH MINERALS) tablet Take 1 tablet by mouth daily.     Probiotic Product (PROBIOTIC DAILY PO) Take 1 capsule by mouth daily.     tetrahydrozoline-zinc (VISINE-AC) 0.05-0.25 % ophthalmic solution Place 2 drops into both eyes 3 (three) times daily as needed (dry eyes).     vitamin B-12 (CYANOCOBALAMIN) 1000 MCG tablet Take 1,000 mcg by mouth daily.      Results for orders placed or performed during the hospital encounter of 01/07/21 (from the past 48 hour(s))  Pregnancy, urine per protocol     Status: None   Collection Time: 01/07/21  9:16 AM  Result Value Ref Range   Preg Test, Ur NEGATIVE NEGATIVE    Comment:        THE SENSITIVITY OF THIS METHODOLOGY IS >20 mIU/mL. Performed at [facility], [address] [place], [place] [address]   CBC     Status: None   Collection Time: 01/07/21  9:16 AM  Result Value Ref Range   WBC 5.8 4.0 - 10.5 K/uL   RBC 4.55 3.87 - 5.11 MIL/uL   Hemoglobin 13.6 12.0 - 15.0 g/dL   HCT 21.3 08.6 - 57.8 %  MCV 91.4 80.0 - 100.0 fL   MCH 29.9 26.0 - 34.0 pg   MCHC 32.7 30.0 - 36.0 g/dL   RDW 42.7 06.2 - 37.6 %   Platelets 301 150 - 400 K/uL   nRBC 0.0 0.0 - 0.2 %    Comment: Performed at [facility], [address] [place], [place] [address]  Comprehensive metabolic panel     Status: Abnormal   Collection Time: 01/07/21  9:16 AM  Result Value Ref Range   Sodium 139 135 - 145 mmol/L   Potassium 4.4 3.5 - 5.1 mmol/L   Chloride 106 98 - 111 mmol/L   CO2 20 (L) 22 - 32 mmol/L   Glucose, Bld 85 70 - 99 mg/dL    Comment: Glucose reference range applies only to samples taken after fasting for at least 8 hours.   BUN 15 6 - 20 mg/dL   Creatinine, Ser 1.76 0.44 - 1.00 mg/dL   Calcium 9.5 8.9 - 16.0 mg/dL   Total Protein 7.4 6.5 - 8.1 g/dL   Albumin 4.5 3.5 - 5.0 g/dL   AST 17 15 - 41 U/L   ALT 13 0 - 44 U/L    Alkaline Phosphatase 33 (L) 38 - 126 U/L   Total Bilirubin 1.2 0.3 - 1.2 mg/dL   GFR, Estimated >73 >71 mL/min    Comment: (NOTE) Calculated using the CKD-EPI Creatinine Equation (2021)    Anion gap 13 5 - 15    Comment: Performed at [facility], [address] [place], [place] [address]   No results found.  Review of Systems  All other systems reviewed and are negative.  Blood pressure (!) 136/93, pulse (!) 58, temperature 98.5 F (36.9 C), temperature source Oral, resp. rate 11, height 5\' 4"  (1.626 m), weight 77.7 kg, last menstrual period 12/09/2020, SpO2 100 %, unknown if currently breastfeeding. Physical Exam Constitutional:      Appearance: Normal appearance.  HENT:     Head: Normocephalic and atraumatic.  Cardiovascular:     Rate and Rhythm: Normal rate and regular rhythm.  Musculoskeletal:     Comments: On exam of her right knee no skin changes or effusions noted. Tenderness to palpation over the medial lateral joint line. Positive McMurray, positive Lockman. Calf is supple. Neurovascularly intact in her right lower extremity.  Skin:    General: Skin is warm and dry.     Capillary Refill: Capillary refill takes 2 to 3 seconds.  Neurological:     General: No focal deficit present.     Mental Status: She is alert and oriented to person, place, and time.  Psychiatric:        Mood and Affect: Mood normal.        Behavior: Behavior normal.        Thought Content: Thought content normal.        Judgment: Judgment normal.     Assessment/Plan Right knee ACL rupture, medial meniscus tear, lateral meniscus tear: Patient was explained risks and benefits of surgical vs non surgical management. She has tried non surgical management and has failed. She has elected to proceed. She is having a right knee arthroscopy ACL reconstructions, PMM, PLM.   02/09/2021, PA 01/07/2021, 11:14 AM

## 2021-01-07 NOTE — Anesthesia Postprocedure Evaluation (Signed)
Anesthesia Post Note  Patient: JO-ANNE KLUTH  Procedure(s) Performed: KNEE ARTHROSCOPY WITH ANTERIOR CRUCIATE LIGAMENT (ACL) REPAIR partial medial and lateral meniscectomy, repair chondroplasty (Right: Knee)     Patient location during evaluation: PACU Anesthesia Type: Regional Level of consciousness: awake and alert Pain management: pain level controlled Vital Signs Assessment: post-procedure vital signs reviewed and stable Respiratory status: spontaneous breathing, nonlabored ventilation, respiratory function stable and patient connected to nasal cannula oxygen Cardiovascular status: blood pressure returned to baseline and stable Postop Assessment: no apparent nausea or vomiting Anesthetic complications: no   No notable events documented.  Last Vitals:  Vitals:   01/07/21 1430 01/07/21 1445  BP: (!) 145/101 (!) 151/104  Pulse: 95 84  Resp: 12 20  Temp:    SpO2: 100% 100%    Last Pain:  Vitals:   01/07/21 1445  TempSrc:   PainSc: 0-No pain                 Cristin Szatkowski

## 2021-01-13 ENCOUNTER — Encounter (HOSPITAL_COMMUNITY): Payer: Self-pay | Admitting: Specialist

## 2021-06-23 ENCOUNTER — Other Ambulatory Visit: Payer: Self-pay | Admitting: Family Medicine

## 2021-06-23 ENCOUNTER — Ambulatory Visit
Admission: RE | Admit: 2021-06-23 | Discharge: 2021-06-23 | Disposition: A | Discharge status: Home / Self Care | Payer: Medicaid Other | Source: Ambulatory Visit | Attending: Family Medicine | Admitting: Family Medicine

## 2021-06-23 DIAGNOSIS — R0602 Shortness of breath: Secondary | ICD-10-CM

## 2021-06-25 ENCOUNTER — Encounter (HOSPITAL_COMMUNITY): Payer: Self-pay | Admitting: *Deleted

## 2021-06-25 ENCOUNTER — Emergency Department (HOSPITAL_COMMUNITY)
Admission: EM | Admit: 2021-06-25 | Discharge: 2021-06-25 | Disposition: A | Discharge status: Home / Self Care | Payer: Medicaid Other | Attending: Emergency Medicine | Admitting: Emergency Medicine

## 2021-06-25 ENCOUNTER — Other Ambulatory Visit: Payer: Self-pay

## 2021-06-25 ENCOUNTER — Emergency Department (HOSPITAL_COMMUNITY): Payer: Medicaid Other

## 2021-06-25 DIAGNOSIS — I10 Essential (primary) hypertension: Secondary | ICD-10-CM | POA: Diagnosis not present

## 2021-06-25 DIAGNOSIS — R0602 Shortness of breath: Secondary | ICD-10-CM | POA: Diagnosis present

## 2021-06-25 DIAGNOSIS — F17200 Nicotine dependence, unspecified, uncomplicated: Secondary | ICD-10-CM | POA: Insufficient documentation

## 2021-06-25 LAB — COMPREHENSIVE METABOLIC PANEL
ALT: 17 U/L (ref 0–44)
AST: 18 U/L (ref 15–41)
Albumin: 4.2 g/dL (ref 3.5–5.0)
Alkaline Phosphatase: 35 U/L — ABNORMAL LOW (ref 38–126)
Anion gap: 8 (ref 5–15)
BUN: 13 mg/dL (ref 6–20)
CO2: 22 mmol/L (ref 22–32)
Calcium: 9.1 mg/dL (ref 8.9–10.3)
Chloride: 105 mmol/L (ref 98–111)
Creatinine, Ser: 0.74 mg/dL (ref 0.44–1.00)
GFR, Estimated: >60 mL/min (ref >60)
Glucose, Bld: 96 mg/dL (ref 70–99)
Potassium: 4.1 mmol/L (ref 3.5–5.1)
Sodium: 135 mmol/L (ref 135–145)
Total Bilirubin: 0.8 mg/dL (ref 0.3–1.2)
Total Protein: 7.1 g/dL (ref 6.5–8.1)

## 2021-06-25 LAB — CBC WITH DIFFERENTIAL/PLATELET
Abs Immature Granulocytes: 0.01 10*3/uL (ref 0.00–0.07)
Basophils Absolute: 0 10*3/uL (ref 0.0–0.1)
Basophils Relative: 0 %
Eosinophils Absolute: 0.6 10*3/uL — ABNORMAL HIGH (ref 0.0–0.5)
Eosinophils Relative: 10 %
HCT: 41.3 % (ref 36.0–46.0)
Hemoglobin: 14.3 g/dL (ref 12.0–15.0)
Immature Granulocytes: 0 %
Lymphocytes Relative: 38 %
Lymphs Abs: 2.3 10*3/uL (ref 0.7–4.0)
MCH: 30.6 pg (ref 26.0–34.0)
MCHC: 34.6 g/dL (ref 30.0–36.0)
MCV: 88.4 fL (ref 80.0–100.0)
Monocytes Absolute: 0.5 10*3/uL (ref 0.1–1.0)
Monocytes Relative: 7 %
Neutro Abs: 2.8 10*3/uL (ref 1.7–7.7)
Neutrophils Relative %: 45 %
Platelets: 255 10*3/uL (ref 150–400)
RBC: 4.67 MIL/uL (ref 3.87–5.11)
RDW: 12.6 % (ref 11.5–15.5)
WBC: 6.2 10*3/uL (ref 4.0–10.5)
nRBC: 0 % (ref 0.0–0.2)

## 2021-06-25 LAB — I-STAT BETA HCG BLOOD, ED (MC, WL, AP ONLY): I-stat hCG, quantitative: <5 m[IU]/mL (ref <5)

## 2021-06-25 MED ORDER — OMEPRAZOLE 20 MG PO CPDR: 20.0000 mg | DELAYED_RELEASE_CAPSULE | Freq: Every day | ORAL | 0 refills | Status: DC

## 2021-06-25 MED ORDER — FAMOTIDINE 20 MG PO TABS: 20.0000 mg | ORAL_TABLET | Freq: Two times a day (BID) | ORAL | 0 refills | Status: DC

## 2021-06-25 NOTE — ED Provider Triage Note (Signed)
Emergency Medicine Provider Triage Evaluation Note  [name] , a 36 y.o. female  was evaluated in triage.  Pt complains of shortness of breath.  She states her symptoms have been persistent for about 1 week.  Worsens when sitting upright, standing, or with exertion.  Improves when lying down.  She states she smokes marijuana 1-2 times per day but otherwise does not smoke tobacco.  Denies any chest pain, URI symptoms, nausea, or vomiting.  No history of similar symptoms.  Physical Exam  BP (!) 147/98    Pulse 83    Temp 97.6 F (36.4 C) (Oral)    Resp 18    Ht 5\' 4"  (1.626 m)    Wt 77.7 kg    LMP 05/29/2021    SpO2 100%    BMI 29.40 kg/m  Gen:   Awake, no distress   Resp:  Normal effort  MSK:   Moves extremities without difficulty  Other:    Medical Decision Making  Medically screening exam initiated at 5:45 AM.  Appropriate orders placed.  05/31/2021 was informed that the remainder of the evaluation will be completed by another provider, this initial triage assessment does not replace that evaluation, and the importance of remaining in the ED until their evaluation is complete.   Dirk Dress, PA-C 06/25/21 [phone]

## 2021-06-25 NOTE — ED Notes (Signed)
Pt ambulated throughout department without dyspnea. SpO2 remained 100% for entirety.

## 2021-06-25 NOTE — Discharge Instructions (Signed)
Please read and follow all provided instructions.  Your diagnoses today include:  1. Shortness of breath     Tests performed today include: Complete blood cell count: No concerns Basic metabolic panel: No concerns EKG: Was normal Chest x-ray: Was normal Vital signs. See below for your results today.   Medications prescribed:  Pepcid (famotidine) - antihistamine  You can find this medication over-the-counter.   DO NOT exceed:  20mg  Pepcid every 12 hours  Omeprazole (Prilosec) - stomach acid reducer  This medication can be found over-the-counter  Take any prescribed medications only as directed.  Home care instructions:  Follow any educational materials contained in this packet.  BE VERY CAREFUL not to take multiple medicines containing Tylenol (also called acetaminophen). Doing so can lead to an overdose which can damage your liver and cause liver failure and possibly death.   Follow-up instructions: Please follow-up with your primary care provider in the next 7 days for further evaluation of your symptoms.   Return instructions:  Please return to the Emergency Department if you experience worsening symptoms.  Please return if you have any other emergent concerns.  Additional Information:  Your vital signs today were: BP (!) 147/98    Pulse 83    Temp 97.6 F (36.4 C) (Oral)    Resp 18    Ht 5\' 4"  (1.626 m)    Wt 77.7 kg    LMP 05/29/2021    SpO2 100%    BMI 29.40 kg/m  If your blood pressure (BP) was elevated above 135/85 this visit, please have this repeated by your doctor within one month. --------------

## 2021-06-25 NOTE — ED Triage Notes (Signed)
The pt has had sob for one week no cold no cough.  No previous history dec 23rd  lmp

## 2021-06-25 NOTE — ED Provider Notes (Signed)
[facility] EMERGENCY DEPARTMENT Provider Note   CSN: [id number] Arrival date & time: 06/25/21  0530     History  Chief Complaint  Patient presents with   Shortness of Breath    [name] is a 36 y.o. female.  Patient with no significant past medical history, she is a smoker --presents to the emergency department today for shortness of breath.  Symptoms have been ongoing for about a week.  She reports feeling short of breath when she is talking or when she is active, but not when she is lying down.  She denies associated chest pain, shortness of breath.  No URI symptoms, fever or cough.  She feels like her stomach "fills up with carbon dioxide" when she eats.  No vomiting, diarrhea, or urinary symptoms.  She denies history of asthma, bronchitis. Patient denies risk factors for pulmonary embolism including: unilateral leg swelling, history of DVT/PE/other blood clots, use of exogenous hormones, recent immobilizations, recent surgery, recent travel (>4hr segment), malignancy, hemoptysis. Most recent surgery August 2022.        Home Medications Prior to Admission medications   Medication Sig Start Date End Date Taking? Authorizing Provider  Ascorbic Acid (VITAMIN C) 1000 MG tablet Take 1,000 mg by mouth daily.    [provider]  Biotin 1000 MCG tablet Take 1,000 mcg by mouth daily.    [provider]  cholecalciferol (VITAMIN D3) 25 MCG (1000 UNIT) tablet Take 1,000 Units by mouth daily.    [provider]  ibuprofen (ADVIL) 200 MG tablet Take 400 mg by mouth every 8 (eight) hours as needed for moderate pain.    [provider]  magnesium oxide (MAG-OX) 400 MG tablet Take 400 mg by mouth daily.    [provider]  methocarbamol (ROBAXIN) 500 MG tablet Take 1 tablet (500 mg total) by mouth 4 (four) times daily. 01/07/21   Drue Novel, PA  Multiple Vitamins-Minerals (MULTIVITAMIN WITH MINERALS) tablet Take 1 tablet  by mouth daily.    [provider]  ondansetron (ZOFRAN) 4 MG tablet Take 1 tablet (4 mg total) by mouth daily as needed for nausea or vomiting. 01/07/21 01/07/22  Drue Novel, PA  Probiotic Product (PROBIOTIC DAILY PO) Take 1 capsule by mouth daily.    [provider]  tetrahydrozoline-zinc (VISINE-AC) 0.05-0.25 % ophthalmic solution Place 2 drops into both eyes 3 (three) times daily as needed (dry eyes).    [provider]  vitamin B-12 (CYANOCOBALAMIN) 1000 MCG tablet Take 1,000 mcg by mouth daily.    [provider]      Allergies    Patient has no known allergies.    Review of Systems   Review of Systems  Constitutional:  Negative for fever.  HENT:  Negative for rhinorrhea and sore throat.   Eyes:  Negative for redness.  Respiratory:  Positive for shortness of breath. Negative for cough.   Cardiovascular:  Negative for chest pain.  Gastrointestinal:  Negative for abdominal pain, diarrhea, nausea and vomiting.  Genitourinary:  Negative for dysuria, frequency, hematuria and urgency.  Musculoskeletal:  Negative for myalgias.  Skin:  Negative for rash.  Neurological:  Negative for headaches.   Physical Exam Updated Vital Signs BP (!) 147/98    Pulse 83    Temp 97.6 F (36.4 C) (Oral)    Resp 18    Ht 5\' 4"  (1.626 m)    Wt 77.7 kg    LMP 05/29/2021  SpO2 100%    BMI 29.40 kg/m   Physical Exam Vitals and nursing note reviewed.  Constitutional:      General: She is not in acute distress.    Appearance: She is well-developed.  HENT:     Head: Normocephalic and atraumatic.     Right Ear: External ear normal.     Left Ear: External ear normal.     Nose: Nose normal.  Eyes:     Conjunctiva/sclera: Conjunctivae normal.  Cardiovascular:     Rate and Rhythm: Normal rate and regular rhythm.     Heart sounds: No murmur heard. Pulmonary:     Effort: No respiratory distress.     Breath sounds: No wheezing, rhonchi or rales.  Abdominal:      Palpations: Abdomen is soft.     Tenderness: There is no abdominal tenderness. There is no guarding or rebound.  Musculoskeletal:     Cervical back: Normal range of motion and neck supple.     Right lower leg: No tenderness. No edema.     Left lower leg: No tenderness. No edema.  Skin:    General: Skin is warm and dry.     Findings: No rash.  Neurological:     General: No focal deficit present.     Mental Status: She is alert. Mental status is at baseline.     Motor: No weakness.  Psychiatric:        Mood and Affect: Mood normal.    ED Results / Procedures / Treatments   Labs (all labs ordered are listed, but only abnormal results are displayed) Labs Reviewed  COMPREHENSIVE METABOLIC PANEL - Abnormal; Notable for the following components:      Result Value   Alkaline Phosphatase 35 (*)    All other components within normal limits  CBC WITH DIFFERENTIAL/PLATELET - Abnormal; Notable for the following components:   Eosinophils Absolute 0.6 (*)    All other components within normal limits  I-STAT BETA HCG BLOOD, ED ([facility], [facility], [facility] ONLY)    EKG EKG Interpretation  Date/Time:  Thursday June 25 2021 05:39:09 EST Ventricular Rate:  75 PR Interval:  138 QRS Duration: 82 QT Interval:  380 QTC Calculation: 424 R Axis:   74 Text Interpretation: Normal sinus rhythm Normal ECG Confirmed by Quintella Reichert [id number] on 06/25/2021 6:48:19 AM  Radiology DG Chest 2 View  Result Date: 06/25/2021 CLINICAL DATA:  Shortness of breath EXAM: CHEST - 2 VIEW COMPARISON:  06/23/2021 FINDINGS: The heart size and mediastinal contours are within normal limits. Both lungs are clear. The visualized skeletal structures are unremarkable. IMPRESSION: No active cardiopulmonary disease. Electronically Signed   By: Kerby Moors M.D.   On: 06/25/2021 06:16   DG Chest 2 View  Result Date: 06/23/2021 CLINICAL DATA:  Progressive shortness of breath for the past week. Current smoker. EXAM: CHEST - 2 VIEW  COMPARISON:  02/07/2006 FINDINGS: Normal cardiac silhouette and mediastinal contours. No focal parenchymal opacities. No pleural effusion or pneumothorax. No evidence of edema. No acute osseous abnormalities. IMPRESSION: No acute cardiopulmonary disease. Electronically Signed   By: Sandi Mariscal M.D.   On: 06/23/2021 15:42    Procedures Procedures    Medications Ordered in ED Medications - No data to display  ED Course/ Medical Decision Making/ A&P    Patient seen and examined. History obtained directly from patient. Work-up including labs, imaging, EKG ordered in triage, if performed, were reviewed.    Labs/EKG: Independently reviewed and interpreted.  This included:  Negative pregnancy, CBC with normal WBC and RBC, CMP with normal kidney function and electrolytes.  Imaging: Independently reviewed and interpreted.  This included: Chest x-ray.  Agree that chest x-ray is negative without signs of pneumonia or edema.  Medications/Fluids: Ordered: None.  Considered administration of: Anxiolytic, however patient appears comfortable  Most recent vital signs reviewed and are as follows: BP (!) 147/98    Pulse 83    Temp 97.6 F (36.4 C) (Oral)    Resp 18    Ht 5\' 4"  (1.626 m)    Wt 77.7 kg    LMP 05/29/2021    SpO2 100%    BMI 29.40 kg/m   Initial impression: Shortness of breath, unspecified, possible GI or anxiety component.  Considered PE, however patient is PERC negative and I feel risk of PE is low.  Chronicity, pattern, and symptomatology could also be atypical for ACS with shortness of breath as an anginal equivalent.  Plan: Will have patient ambulate on pulse ox as activity seems to worsen her symptoms.  If she does not develop hypoxia, would discharge patient home with close PCP follow-up.  She is in agreement.  7:31 AM Reassessment performed. Patient appears stable.   Labs and imaging personally reviewed and interpreted including: Ambulatory pulse ox which was normal.   Reviewed  additional pertinent lab work and imaging with patient at bedside including: Reviewed labs from earlier.  Most current vital signs reviewed and are as follows: BP (!) 147/98    Pulse 83    Temp 97.6 F (36.4 C) (Oral)    Resp 18    Ht 5\' 4"  (1.626 m)    Wt 77.7 kg    LMP 05/29/2021    SpO2 100%    BMI 29.40 kg/m   Plan: Discharged home with PCP follow-up.  Given change in symptoms with food, will trial H2 blocker and PPI.  Home treatment: Prescription written for omeprazole and Pepcid.   Return and follow-up instructions: Encouraged return to ED with development of chest pain, shortness of breath, lightheadedness or syncope. Encouraged patient to follow-up with their provider in 7 days. Patient verbalized understanding and agreed with plan.                            Medical Decision Making  For this patient's complaint of shortness of breath, the following emergent conditions were considered on the differential diagnosis: sepsis/pneumonia, CHF exacerbation, acute coronary syndrome, pericarditis/myocarditis, cardiac arrhythmia, pneumothorax, pulmonary embolism, severe COPD or asthma exacerbation, metabolic acidosis, severe anemia, large ascites or intraabdominal mass.   Other causes were also considered including: URI/bronchitis, GERD, anxiety, restrictive lung disease, pulmonary hypertension, deconditioning.  The patient's vital signs, pertinent lab work and imaging were reviewed and interpreted as discussed in the ED course. Hospitalization was considered for further testing, treatments, or serial exams/observation. However as patient is well-appearing, has a stable exam, and reassuring studies today, I do not feel that they warrant admission at this time. This plan was discussed with the patient who verbalizes agreement and comfort with this plan and seems reliable and able to return to the Emergency Department with worsening or changing symptoms.          Final Clinical Impression(s)  / ED Diagnoses Final diagnoses:  Shortness of breath    Rx / DC Orders ED Discharge Orders          Ordered    famotidine (PEPCID) 20 MG tablet  2  times daily        06/25/21 0728    omeprazole (PRILOSEC) 20 MG capsule  Daily        06/25/21 0728              Carlisle Cater, PA-C 06/25/21 1600    Jeanell Sparrow, DO 06/25/21 [phone]

## 2022-05-12 ENCOUNTER — Other Ambulatory Visit: Payer: Self-pay | Admitting: Obstetrics & Gynecology

## 2022-07-12 ENCOUNTER — Encounter (HOSPITAL_COMMUNITY): Payer: Self-pay | Admitting: *Deleted

## 2022-07-12 ENCOUNTER — Other Ambulatory Visit: Payer: Self-pay

## 2022-07-12 ENCOUNTER — Ambulatory Visit (HOSPITAL_COMMUNITY)
Admission: EM | Admit: 2022-07-12 | Discharge: 2022-07-12 | Disposition: A | Discharge status: Home / Self Care | Payer: Medicaid Other | Attending: Family Medicine | Admitting: Family Medicine

## 2022-07-12 DIAGNOSIS — J020 Streptococcal pharyngitis: Secondary | ICD-10-CM | POA: Diagnosis not present

## 2022-07-12 LAB — POC INFLUENZA A AND B ANTIGEN (URGENT CARE ONLY)
Influenza A Ag: NEGATIVE
Influenza B Ag: NEGATIVE

## 2022-07-12 LAB — POCT RAPID STREP A, ED / UC: Streptococcus, Group A Screen (Direct): POSITIVE — AB

## 2022-07-12 MED ORDER — AMOXICILLIN 500 MG PO CAPS: 500.0000 mg | ORAL_CAPSULE | Freq: Three times a day (TID) | ORAL | 0 refills | Status: AC

## 2022-07-12 NOTE — ED Provider Notes (Signed)
[place]    CSN: [id number] Arrival date & time: 07/12/22  7253      History   Chief Complaint Chief Complaint  Patient presents with   Headache   Generalized Body Aches   Otalgia    HPI [name] is a 37 y.o. female.   Patient is here for headache, left ear pain, sore throat, body aches, night sweats and chills.  Started yesterday.  She did have n/v last night and this morning.  She did take nqyuil last night.  There is strep at the house, no flu/covid.        Past Medical History:  Diagnosis Date   Anxiety    no meds   Arthritis    Depression    no meds   PCOS (polycystic ovarian syndrome)     Patient Active Problem List   Diagnosis Date Noted   GBS bacteriuria 06/23/2015   History of depression 06/23/2015   History of anxiety 06/23/2015   History of PCOS 06/23/2015   Morbid obesity ([place]) 06/23/2015   Postpartum tubal ligation planned 06/23/2015   Genital herpes 06/23/2015   History of prior pregnancy with SGA newborn x2 06/23/2015   History of gestational hypertension - first delivery - no meds 06/23/2015   H/O migraine 06/23/2015   Vaginal delivery 06/23/2015    Past Surgical History:  Procedure Laterality Date   BREAST REDUCTION SURGERY     KNEE ARTHROSCOPY WITH ANTERIOR CRUCIATE LIGAMENT (ACL) REPAIR Right 01/07/2021   Procedure: KNEE ARTHROSCOPY WITH ANTERIOR CRUCIATE LIGAMENT (ACL) REPAIR partial medial and lateral meniscectomy, repair chondroplasty;  Surgeon: Sydnee Cabal, MD;  Location: [facility];  Service: Orthopedics;  Laterality: Right;  with adductor cancal and knee block   TUBAL LIGATION Bilateral 06/24/2015   Procedure: POST PARTUM TUBAL LIGATION;  Surgeon: Ena Dawley, MD;  Location: [facility];  Service: Gynecology;  Laterality: Bilateral;   WISDOM TOOTH EXTRACTION      OB History     Gravida  3   Para  3   Term  2   Preterm  1   AB      Living  3      SAB      IAB      Ectopic      Multiple   0   Live Births  3            Home Medications    Prior to Admission medications   Medication Sig Start Date End Date Taking? Authorizing Provider  Ascorbic Acid (VITAMIN C) 1000 MG tablet Take 1,000 mg by mouth daily.    [provider]  Biotin 1000 MCG tablet Take 1,000 mcg by mouth daily.    [provider]  cholecalciferol (VITAMIN D3) 25 MCG (1000 UNIT) tablet Take 1,000 Units by mouth daily.    [provider]  famotidine (PEPCID) 20 MG tablet Take 1 tablet (20 mg total) by mouth 2 (two) times daily. 06/25/21   Carlisle Cater, PA-C  ibuprofen (ADVIL) 200 MG tablet Take 400 mg by mouth every 8 (eight) hours as needed for moderate pain.    [provider]  magnesium oxide (MAG-OX) 400 MG tablet Take 400 mg by mouth daily.    [provider]  methocarbamol (ROBAXIN) 500 MG tablet Take 1 tablet (500 mg total) by mouth 4 (four) times daily. 01/07/21   Drue Novel, PA  Multiple Vitamins-Minerals (MULTIVITAMIN WITH MINERALS) tablet Take 1 tablet by mouth daily.  [provider]  omeprazole (PRILOSEC) 20 MG capsule Take 1 capsule (20 mg total) by mouth daily. 06/25/21   Carlisle Cater, PA-C  Probiotic Product (PROBIOTIC DAILY PO) Take 1 capsule by mouth daily.    [provider]  tetrahydrozoline-zinc (VISINE-AC) 0.05-0.25 % ophthalmic solution Place 2 drops into both eyes 3 (three) times daily as needed (dry eyes).    [provider]  vitamin B-12 (CYANOCOBALAMIN) 1000 MCG tablet Take 1,000 mcg by mouth daily.    [provider]    Family History Family History  Problem Relation Age of Onset   Hypertension Maternal Grandmother    Cancer Maternal Grandmother    Diabetes Maternal Grandfather    Cancer Maternal Grandfather    Diabetes Paternal Grandmother    Hypertension Paternal Grandfather    Sleep apnea Mother    Hypertension Mother    Sleep apnea Father    Hypertension Father    Healthy  Daughter    Healthy Son    Healthy Son     Social History Social History   Tobacco Use   Smoking status: Every Day    Types: Cigarettes    Last attempt to quit: 06/16/2011    Years since quitting: 11.0   Smokeless tobacco: Never  Vaping Use   Vaping Use: Former  Substance Use Topics   Alcohol use: Yes    Comment: occ   Drug use: No     Allergies   Patient has no known allergies.   Review of Systems Review of Systems  Constitutional:  Positive for chills, fatigue and fever.  HENT:  Positive for ear pain.   Respiratory:  Positive for cough.   Cardiovascular: Negative.   Gastrointestinal:  Positive for nausea and vomiting.  Skin: Negative.   Neurological:  Positive for headaches.  Psychiatric/Behavioral: Negative.       Physical Exam Triage Vital Signs ED Triage Vitals  Enc Vitals Group     BP 07/12/22 1048 (!) 140/95     Pulse Rate 07/12/22 1048 (!) 117     Resp 07/12/22 1048 18     Temp 07/12/22 1048 (!) 101.9 F (38.8 C)     Temp src --      SpO2 07/12/22 1048 95 %     Weight --      Height --      Head Circumference --      Peak Flow --      Pain Score 07/12/22 1046 6     Pain Loc --      Pain Edu? --      Excl. in [place]? --    No data found.  Updated Vital Signs BP (!) 140/95   Pulse (!) 117   Temp (!) 101.9 F (38.8 C)   Resp 18   LMP 05/07/2022 (Approximate)   SpO2 95%   Visual Acuity Right Eye Distance:   Left Eye Distance:   Bilateral Distance:    Right Eye Near:   Left Eye Near:    Bilateral Near:     Physical Exam Constitutional:      Appearance: Normal appearance.  HENT:     Right Ear: A middle ear effusion is present.     Left Ear: A middle ear effusion is present.     Mouth/Throat:     Pharynx: Posterior oropharyngeal erythema present. No oropharyngeal exudate.     Tonsils: No tonsillar exudate.  Cardiovascular:     Rate and Rhythm: Normal rate and regular  rhythm.  Pulmonary:     Effort: Pulmonary effort is normal.      Breath sounds: Normal breath sounds.  Musculoskeletal:     Cervical back: Normal range of motion and neck supple. Tenderness present.  Skin:    General: Skin is warm.  Neurological:     General: No focal deficit present.     Mental Status: She is alert.  Psychiatric:        Mood and Affect: Mood normal.      UC Treatments / Results  Labs (all labs ordered are listed, but only abnormal results are displayed) Labs Reviewed  POCT RAPID STREP A, ED / UC - Abnormal; Notable for the following components:      Result Value   Streptococcus, Group A Screen (Direct) POSITIVE (*)    All other components within normal limits  POC INFLUENZA A AND B ANTIGEN (URGENT CARE ONLY)    EKG   Radiology No results found.  Procedures Procedures (including critical care time)  Medications Ordered in UC Medications - No data to display  Initial Impression / Assessment and Plan / UC Course  I have reviewed the triage vital signs and the nursing notes.  Pertinent labs & imaging results that were available during my care of the patient were reviewed by me and considered in my medical decision making (see chart for details).   Final Clinical Impressions(s) / UC Diagnoses   Final diagnoses:  Streptococcal sore throat     Discharge Instructions      You were diagnosed with strep throat.  Your flu swab was negative.   I have sent out amoxicillin three times/day x 10 days.  Continue tylenol and motrin for headache, pain and fever.  You may also use salt water gargles.  Get plenty of rest and fluids.  New toothbrush in 2 days, and clean/launder all pillow cases and bedding.     ED Prescriptions     Medication Sig Dispense Auth. Provider   amoxicillin (AMOXIL) 500 MG capsule Take 1 capsule (500 mg total) by mouth 3 (three) times daily for 10 days. 30 capsule Rondel Oh, MD      PDMP not reviewed this encounter.   Rondel Oh, MD 07/12/22 [phone]

## 2022-07-12 NOTE — Discharge Instructions (Addendum)
You were diagnosed with strep throat.  Your flu swab was negative.   I have sent out amoxicillin three times/day x 10 days.  Continue tylenol and motrin for headache, pain and fever.  You may also use salt water gargles.  Get plenty of rest and fluids.  New toothbrush in 2 days, and clean/launder all pillow cases and bedding.

## 2022-07-12 NOTE — ED Triage Notes (Signed)
Pt reports Lt ear pain ,cough and Ha that started yesterday.

## 2022-07-12 NOTE — ED Triage Notes (Signed)
Pt reports childern have had strep throat and she has a sore throat

## 2022-11-29 IMAGING — CR DG CHEST 2V
2 series · 2 of 2 positions shown · non-contrast
Comparison: 02/07/2006

CLINICAL DATA: Progressive shortness of breath for the past week.
Current smoker.

EXAM:
CHEST - 2 VIEW

[w chest pa]
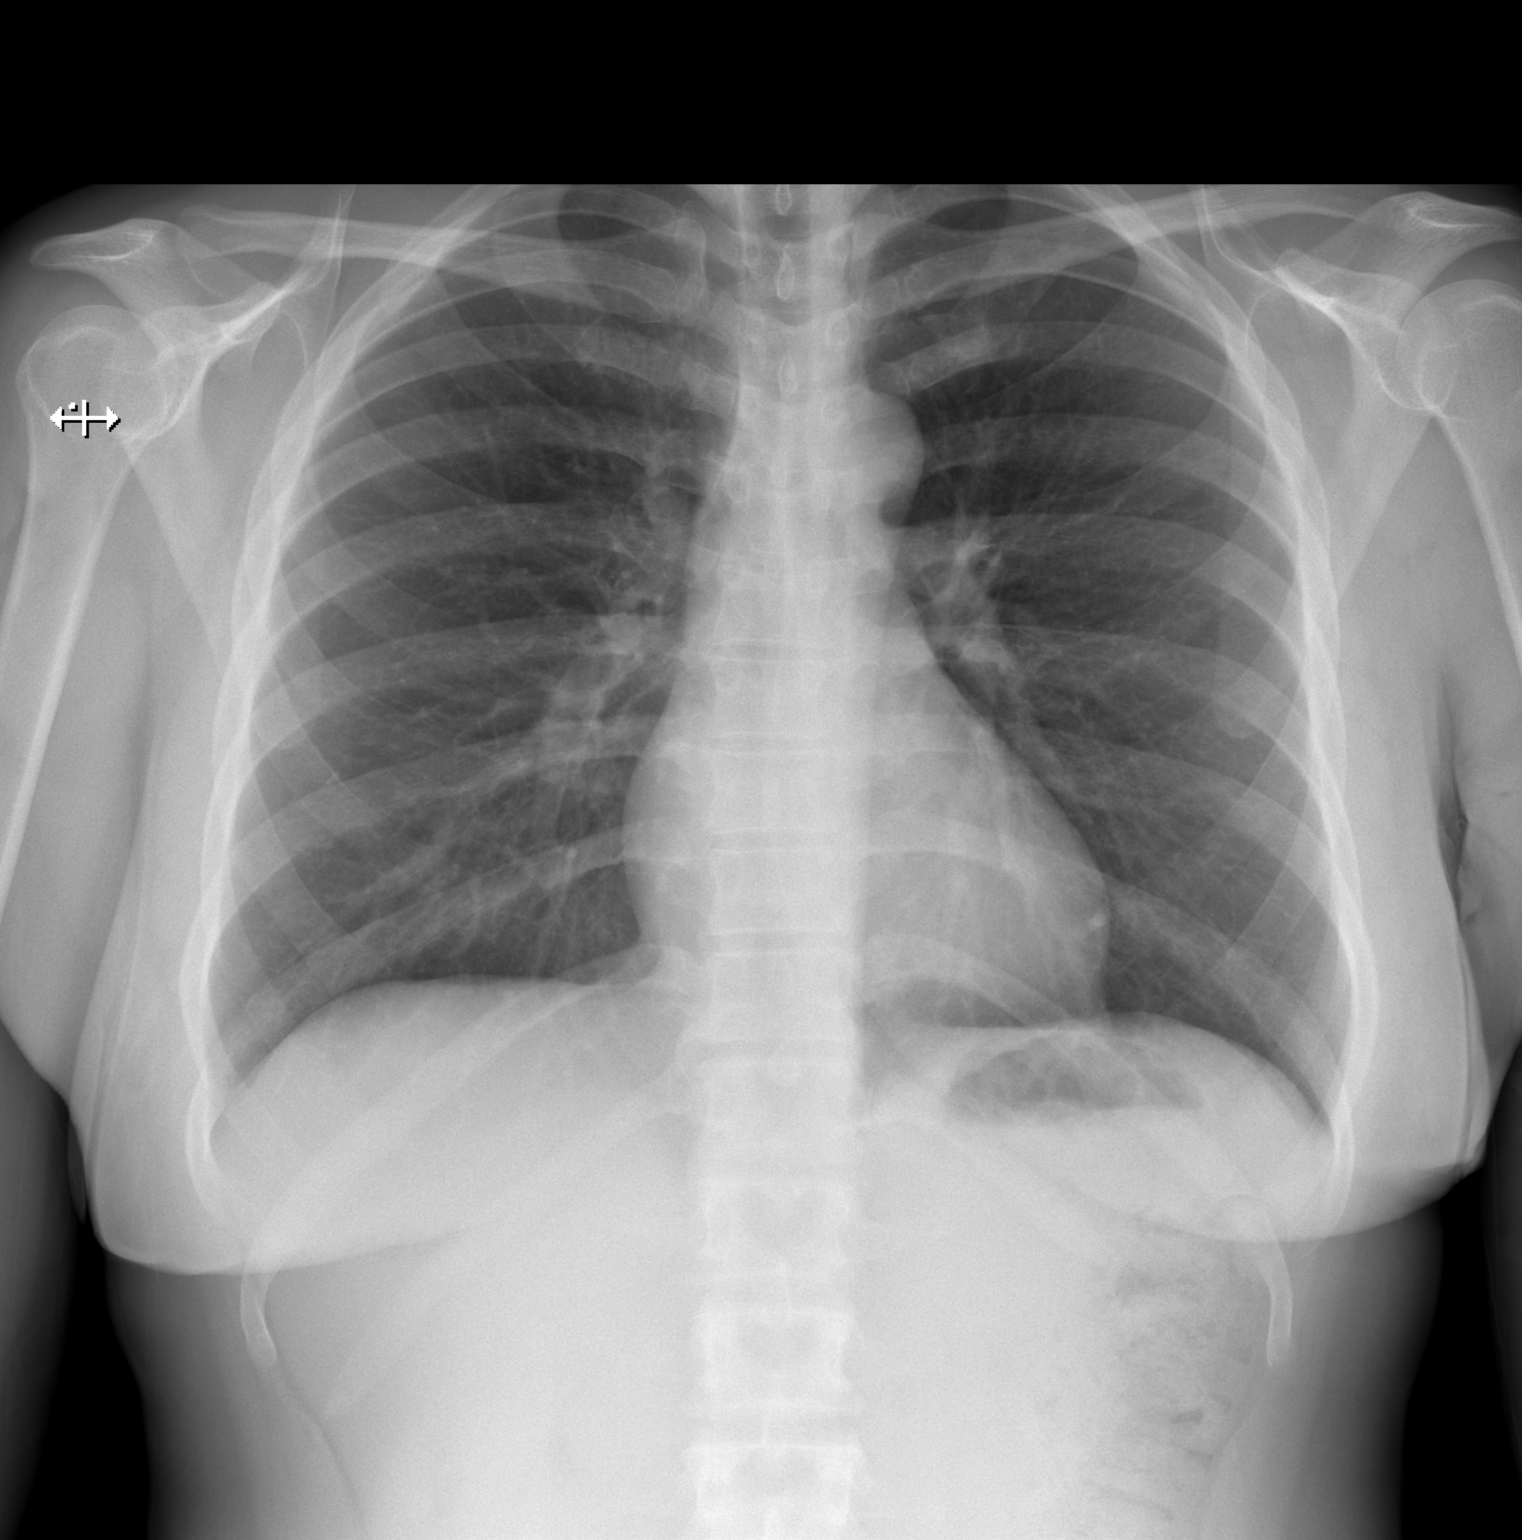

[w chest lat]
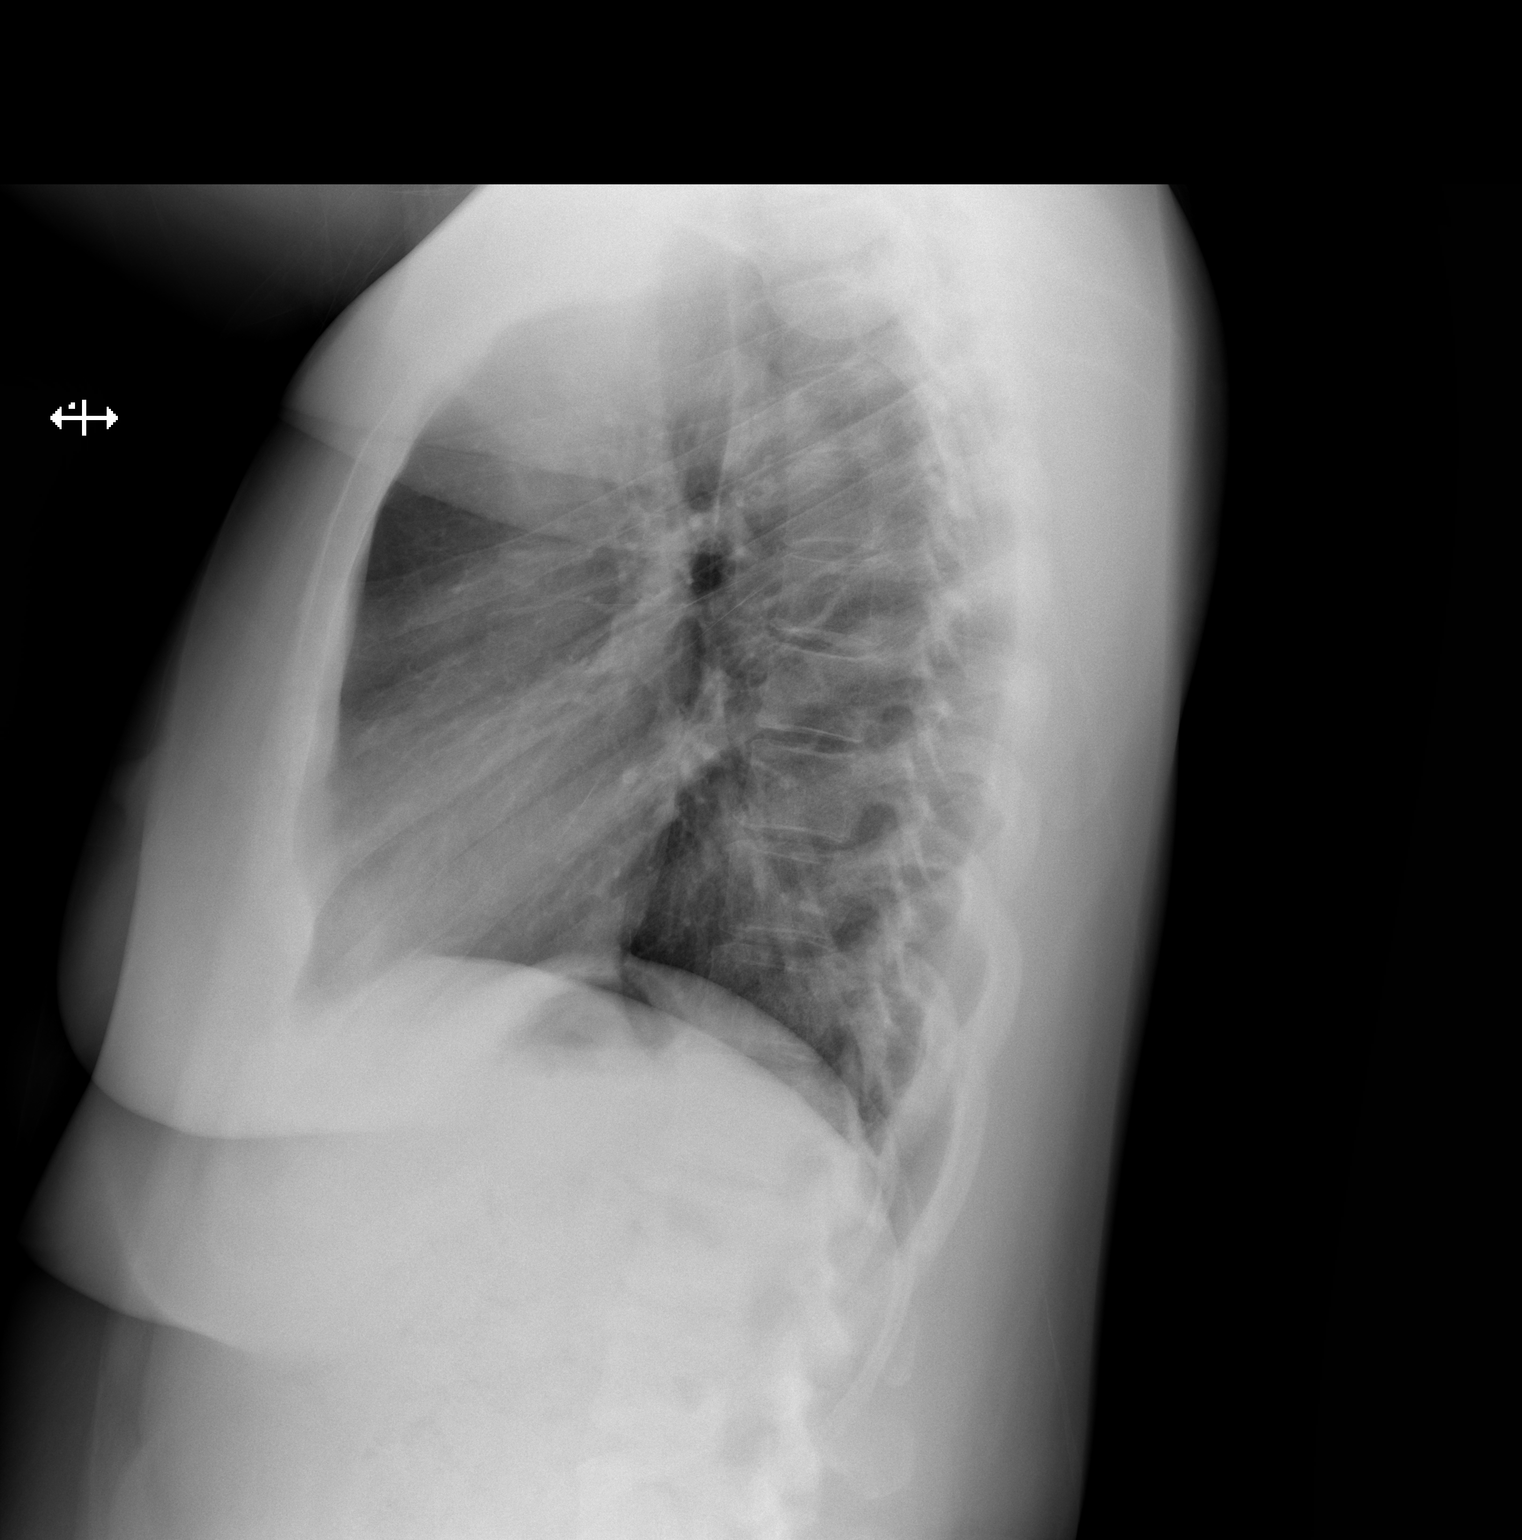

[2 of 2 positions shown; findings below may reference images not displayed]

FINDINGS: Normal cardiac silhouette and mediastinal contours. No focal
parenchymal opacities. No pleural effusion or pneumothorax. No
evidence of edema. No acute osseous abnormalities.
IMPRESSION: No acute cardiopulmonary disease.

## 2022-12-01 IMAGING — CR DG CHEST 2V
2 series · 2 of 2 positions shown · non-contrast
Comparison: 06/23/2021

CLINICAL DATA: Shortness of breath

EXAM:
CHEST - 2 VIEW

[chest pa]
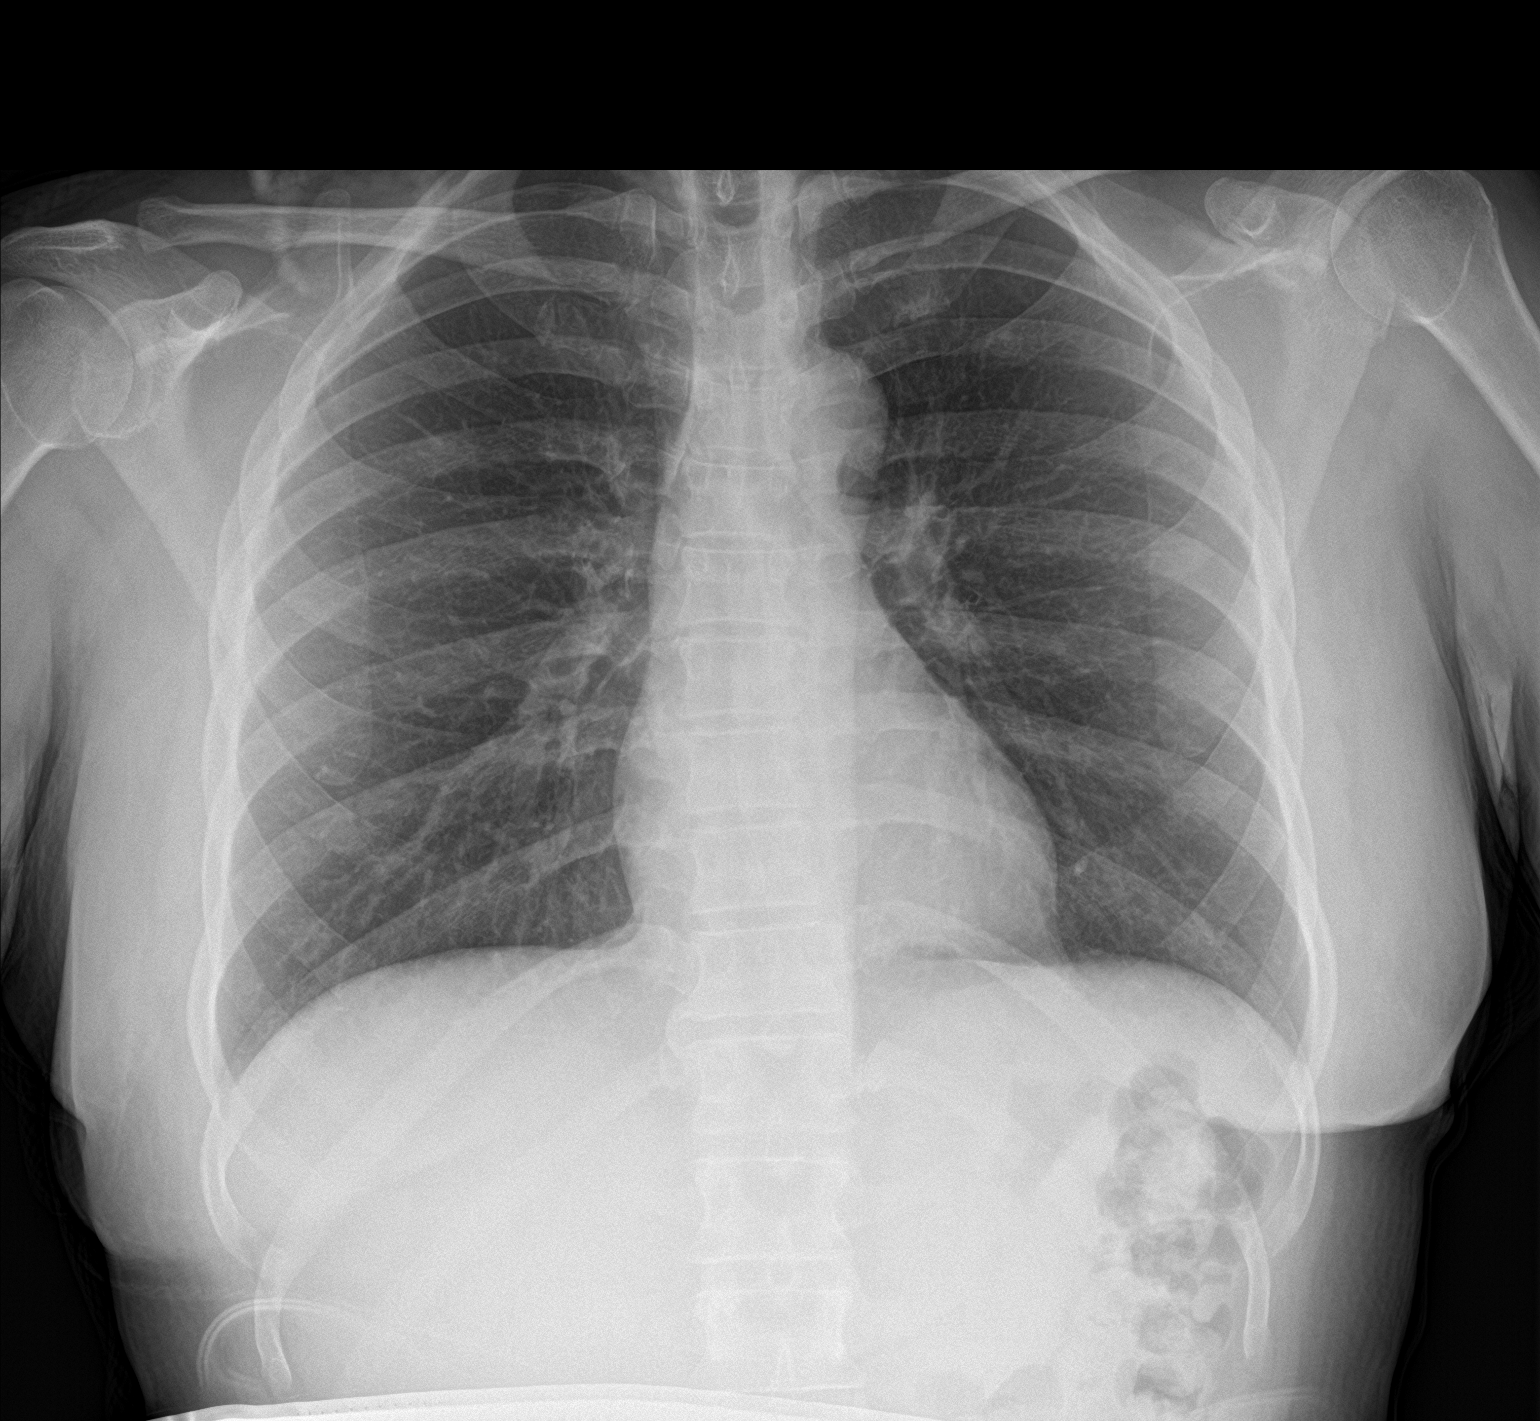

[chest lat]
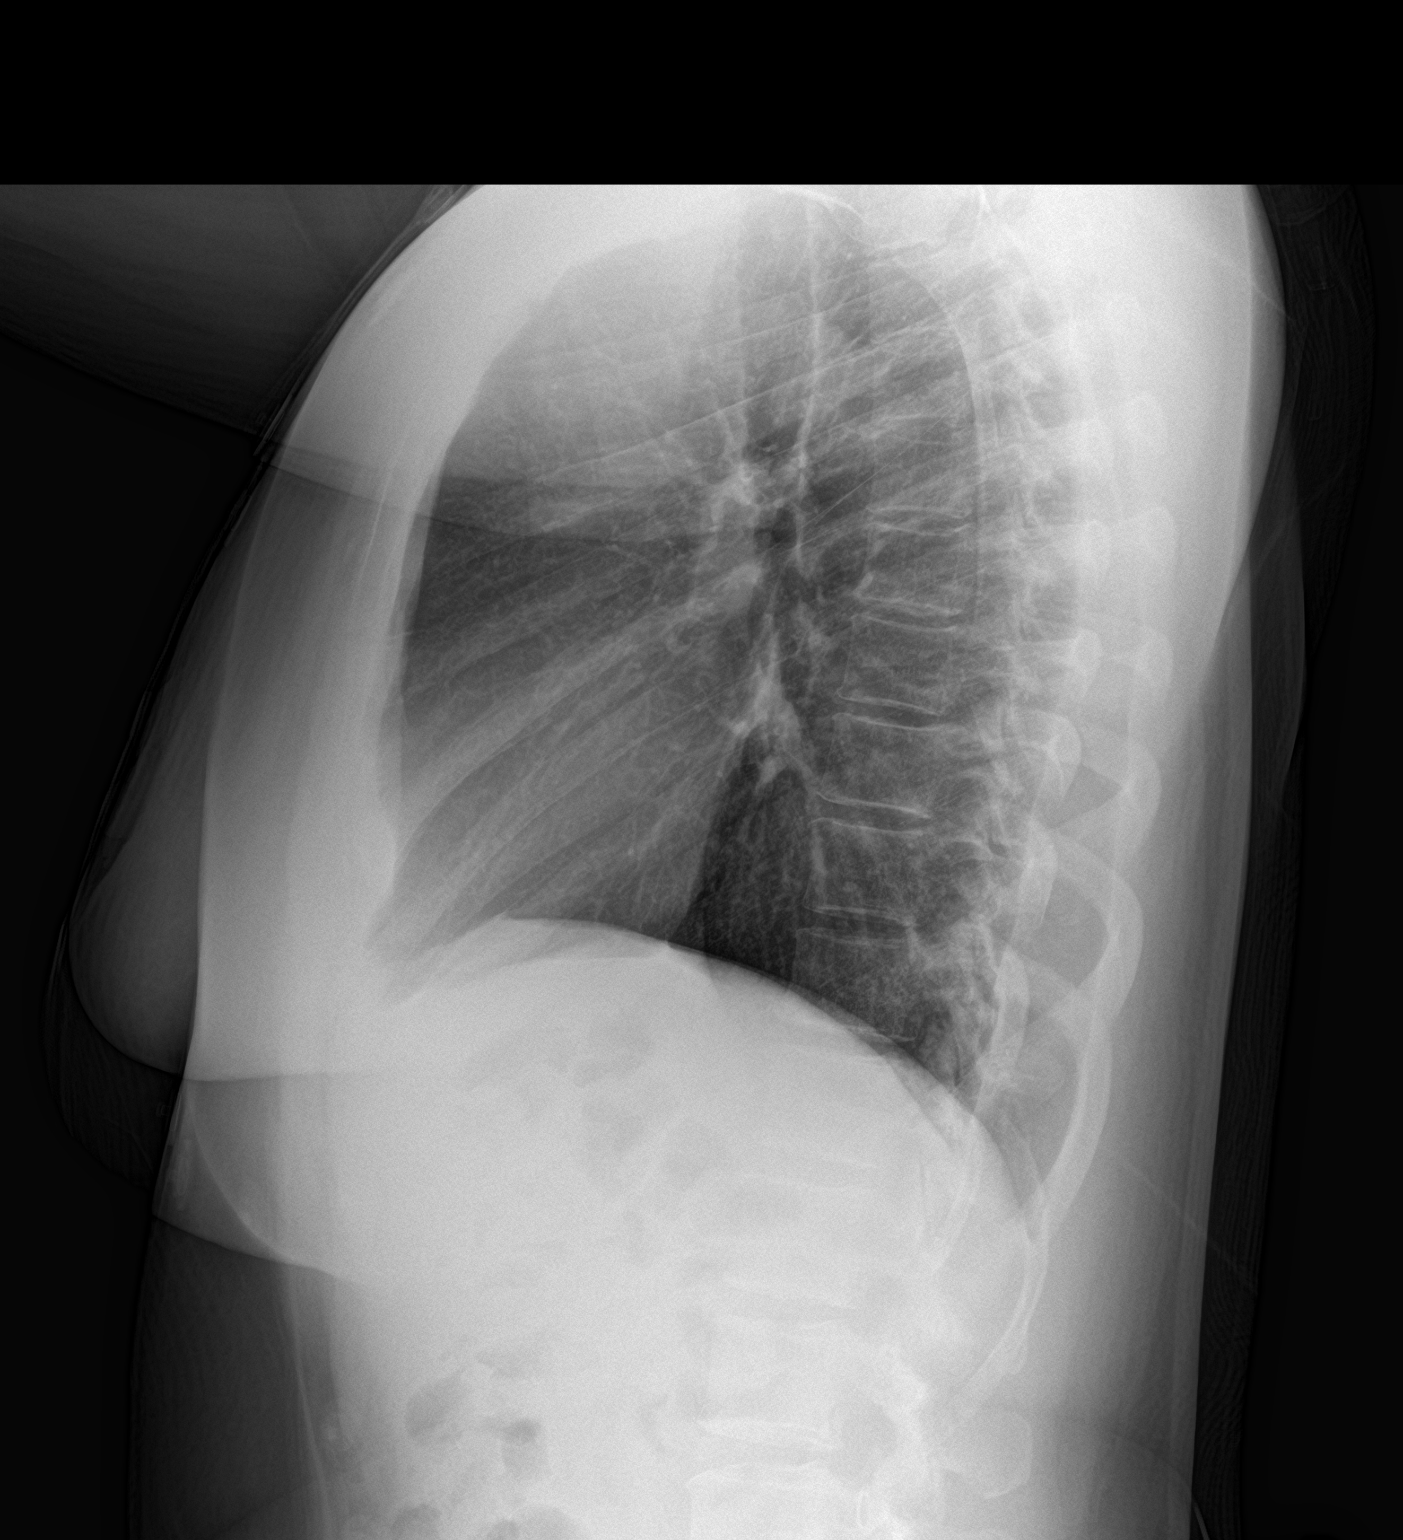

[2 of 2 positions shown; findings below may reference images not displayed]

FINDINGS: The heart size and mediastinal contours are within normal limits.
Both lungs are clear. The visualized skeletal structures are
unremarkable.
IMPRESSION: No active cardiopulmonary disease.
